# Patient Record
Sex: Female | Born: 1970 | Race: White | Hispanic: No | Smoking: Never smoker
Health system: Southern US, Community
[De-identification: ages and names within clinical notes are randomized; demographics above are authoritative.]

## PROBLEM LIST (undated history)

## (undated) DIAGNOSIS — F419 Anxiety disorder, unspecified: Secondary | ICD-10-CM

## (undated) DIAGNOSIS — H8109 Meniere's disease, unspecified ear: Secondary | ICD-10-CM

## (undated) DIAGNOSIS — I10 Essential (primary) hypertension: Secondary | ICD-10-CM

## (undated) DIAGNOSIS — R42 Dizziness and giddiness: Secondary | ICD-10-CM

## (undated) HISTORY — DX: Essential (primary) hypertension: I10

## (undated) HISTORY — PX: TONSILLECTOMY: SUR1361

## (undated) HISTORY — DX: Dizziness and giddiness: R42

## (undated) HISTORY — DX: Anxiety disorder, unspecified: F41.9

---

## 2006-08-19 ENCOUNTER — Emergency Department: Payer: Self-pay | Admitting: General Practice

## 2006-08-19 ENCOUNTER — Other Ambulatory Visit: Payer: Self-pay

## 2007-01-04 ENCOUNTER — Emergency Department: Payer: Self-pay | Admitting: Emergency Medicine

## 2007-02-27 ENCOUNTER — Observation Stay: Payer: Self-pay | Admitting: Internal Medicine

## 2007-02-27 ENCOUNTER — Other Ambulatory Visit: Payer: Self-pay

## 2008-02-25 ENCOUNTER — Emergency Department: Payer: Self-pay | Admitting: Emergency Medicine

## 2009-11-12 ENCOUNTER — Emergency Department: Payer: Self-pay | Admitting: Unknown Physician Specialty

## 2015-10-20 ENCOUNTER — Other Ambulatory Visit: Payer: Self-pay | Admitting: Internal Medicine

## 2015-10-20 DIAGNOSIS — R05 Cough: Secondary | ICD-10-CM

## 2015-10-20 DIAGNOSIS — R059 Cough, unspecified: Secondary | ICD-10-CM

## 2016-11-23 ENCOUNTER — Emergency Department
Admission: EM | Admit: 2016-11-23 | Discharge: 2016-11-24 | Disposition: A | Discharge status: Home / Self Care | Payer: Medicaid Other | Attending: Emergency Medicine | Admitting: Emergency Medicine

## 2016-11-23 ENCOUNTER — Encounter: Payer: Self-pay | Admitting: Emergency Medicine

## 2016-11-23 ENCOUNTER — Emergency Department: Payer: Medicaid Other

## 2016-11-23 DIAGNOSIS — R0602 Shortness of breath: Secondary | ICD-10-CM | POA: Diagnosis present

## 2016-11-23 DIAGNOSIS — J40 Bronchitis, not specified as acute or chronic: Secondary | ICD-10-CM | POA: Insufficient documentation

## 2016-11-23 DIAGNOSIS — R079 Chest pain, unspecified: Secondary | ICD-10-CM

## 2016-11-23 DIAGNOSIS — R072 Precordial pain: Secondary | ICD-10-CM | POA: Diagnosis not present

## 2016-11-23 NOTE — ED Triage Notes (Signed)
Patient with complaint of central chest pain and shortness of breath that started about 1 hour ago. Patient denies any cardiac history.

## 2016-11-24 LAB — BASIC METABOLIC PANEL
ANION GAP: 9 (ref 5–15)
BUN: 12 mg/dL (ref 6–20)
CALCIUM: 9.2 mg/dL (ref 8.9–10.3)
CHLORIDE: 106 mmol/L (ref 101–111)
CO2: 24 mmol/L (ref 22–32)
Creatinine, Ser: 0.83 mg/dL (ref 0.44–1.00)
GFR calc Af Amer: >60 mL/min (ref >60)
GFR calc non Af Amer: >60 mL/min (ref >60)
GLUCOSE: 101 mg/dL — AB (ref 65–99)
Potassium: 3.6 mmol/L (ref 3.5–5.1)
Sodium: 139 mmol/L (ref 135–145)

## 2016-11-24 LAB — TSH: TSH: 2.519 u[IU]/mL (ref 0.350–4.500)

## 2016-11-24 LAB — CBC
HCT: 38.6 % (ref 35.0–47.0)
HEMOGLOBIN: 13.3 g/dL (ref 12.0–16.0)
MCH: 30.9 pg (ref 26.0–34.0)
MCHC: 34.6 g/dL (ref 32.0–36.0)
MCV: 89.2 fL (ref 80.0–100.0)
Platelets: 271 10*3/uL (ref 150–440)
RBC: 4.32 MIL/uL (ref 3.80–5.20)
RDW: 13.4 % (ref 11.5–14.5)
WBC: 12.4 10*3/uL — ABNORMAL HIGH (ref 3.6–11.0)

## 2016-11-24 LAB — FIBRIN DERIVATIVES D-DIMER (ARMC ONLY): Fibrin derivatives D-dimer (ARMC): 209.81 (ref 0.00–499.00)

## 2016-11-24 LAB — TROPONIN I: Troponin I: <0.03 ng/mL (ref <0.03)

## 2016-11-24 MED ORDER — SODIUM CHLORIDE 0.9 % IV BOLUS (SEPSIS)
1000.0000 mL | Freq: Once | INTRAVENOUS | Status: AC
  Administered 2016-11-24: 1000 mL via INTRAVENOUS

## 2016-11-24 MED ORDER — ALBUTEROL SULFATE HFA 108 (90 BASE) MCG/ACT IN AERS: 2.0000 | INHALATION_SPRAY | RESPIRATORY_TRACT | 0 refills | Status: DC | PRN

## 2016-11-24 MED ORDER — IPRATROPIUM-ALBUTEROL 0.5-2.5 (3) MG/3ML IN SOLN
3.0000 mL | Freq: Once | RESPIRATORY_TRACT | Status: AC
  Administered 2016-11-24: 3 mL via RESPIRATORY_TRACT
  Filled 2016-11-24: qty 3

## 2016-11-24 MED ORDER — DIPHENHYDRAMINE HCL 50 MG/ML IJ SOLN: 25.0000 mg | Freq: Once | INTRAMUSCULAR | Status: DC

## 2016-11-24 MED ORDER — HYDROCORTISONE NA SUCCINATE PF 100 MG IJ SOLR
200.0000 mg | Freq: Once | INTRAMUSCULAR | Status: AC
  Administered 2016-11-24: 200 mg via INTRAVENOUS
  Filled 2016-11-24: qty 4

## 2016-11-24 NOTE — ED Notes (Addendum)
Dr Beather Arbour at bedside to discuss plan of care with pt; pt says she's had a reaction before to IV dye but unsure exactly what happened; trying to reach her sister on the phone to see if she remembers

## 2016-11-24 NOTE — ED Notes (Signed)
Visitor has arrived and is at bedside

## 2016-11-24 NOTE — ED Notes (Signed)
Pt updated on status of repeat troponin results. Pt verbalizes understanding.

## 2016-11-24 NOTE — ED Notes (Signed)
Attempt int initiation x1 without success. Pt states she has had to have feet iv and central lines in past. jenna, rn speaking with david, rn requesting iv ultrasound insertion assistance.

## 2016-11-24 NOTE — ED Notes (Signed)
Reaction to IV dye reported to Dr Beather Arbour

## 2016-11-24 NOTE — Discharge Instructions (Signed)
1. You may use albuterol inhaler 2 puffs every 4 hours as needed for wheezing. 2. Return to the ER for worsening symptoms, persistent vomiting, difficulty breathing or other concerns.

## 2016-11-24 NOTE — ED Provider Notes (Signed)
[facility] Emergency Department Provider Note   ____________________________________________   First MD Initiated Contact with Patient 11/24/16 0003     (approximate)  I have reviewed the triage vital signs and the nursing notes.   HISTORY  Chief Complaint Chest Pain and Shortness of Breath    HPI Dawn Taylor is a 46 y.o. female who presents to the ED from home with a chief complaint of chest pain and shortness of breath. Patient reports onset of symptoms approximately one hour ago. Describes sharp substernal chest pain which is nonradiating. Symptoms associated with shortness of breath, wheezing and bilateral arm numbness at home. Denies associated nausea/vomiting, dizziness, palpitations. Patient states this feels similarly when she developed bronchitis. She has been coughing yellow sputum. Denies fever, chills, abdominal pain, diarrhea. Went to [employer] 1 month ago. Symptoms worsened with inspiration.   Past medical history None  There are no active problems to display for this patient.   History reviewed. No pertinent surgical history.  Prior to Admission medications   Medication Sig Start Date End Date Taking? Authorizing Provider  albuterol (PROVENTIL HFA;VENTOLIN HFA) 108 (90 Base) MCG/ACT inhaler Inhale 2 puffs into the lungs every 4 (four) hours as needed for wheezing or shortness of breath. 11/24/16   Paulette Blanch, MD    Allergies Ivp dye [iodinated diagnostic agents]  No family history on file.  Social History Social History  Substance Use Topics  . Smoking status: Never Smoker  . Smokeless tobacco: Never Used  . Alcohol use No    Review of Systems  Constitutional: No fever/chills. Eyes: No visual changes. ENT: No sore throat. Cardiovascular: Positive for chest pain. Respiratory: Positive for shortness of breath. Gastrointestinal: No abdominal pain.  No nausea, no vomiting.  No diarrhea.  No constipation. Genitourinary:  Negative for dysuria. Musculoskeletal: Negative for back pain. Skin: Negative for rash. Neurological: Negative for headaches, focal weakness or numbness.  10-point ROS otherwise negative.  ____________________________________________   PHYSICAL EXAM:  VITAL SIGNS: ED Triage Vitals  Enc Vitals Group     BP --      Pulse --      Resp --      Temp --      Temp src --      SpO2 --      Weight 11/23/16 2344 180 lb (81.6 kg)     Height 11/23/16 2344 5\' 4"  (1.626 m)     Head Circumference --      Peak Flow --      Pain Score 11/23/16 2343 7     Pain Loc --      Pain Edu? --      Excl. in [place]? --     Constitutional: Alert and oriented. Well appearing and in no acute distress. Eyes: Conjunctivae are normal. PERRL. EOMI. Head: Atraumatic. Nose: No congestion/rhinnorhea. Mouth/Throat: Mucous membranes are moist.  Oropharynx non-erythematous. Neck: No stridor.   Cardiovascular: Normal rate, regular rhythm. Grossly normal heart sounds.  Good peripheral circulation.  2+ radial, femoral and distal pulses. Respiratory: Normal respiratory effort.  No retractions. Lungs with scattered wheezing. Gastrointestinal: Soft and nontender. No distention. No abdominal bruits. No CVA tenderness. Musculoskeletal: No lower extremity tenderness nor edema.  No joint effusions. Neurologic:  Normal speech and language. No gross focal neurologic deficits are appreciated.  Skin:  Skin is warm, dry and intact. No rash noted. Psychiatric: Mood and affect are normal. Speech and behavior are normal.  ____________________________________________   LABS (  all labs ordered are listed, but only abnormal results are displayed)  Labs Reviewed  BASIC METABOLIC PANEL - Abnormal; Notable for the following:       Result Value   Glucose, Bld 101 (*)    All other components within normal limits  CBC - Abnormal; Notable for the following:    WBC 12.4 (*)    All other components within normal limits  TROPONIN I    TSH  FIBRIN DERIVATIVES D-DIMER ([facility] ONLY)  TROPONIN I   ____________________________________________  EKG  ED ECG REPORT I, SUNG,JADE J, the attending physician, personally viewed and interpreted this ECG.   Date: 11/24/2016  EKG Time: 2346  Rate: 118  Rhythm: sinus tachycardia  Axis: Normal  Intervals:none  ST&T Change: Nonspecific  ____________________________________________  RADIOLOGY  Chest x-ray interpreted per Dr. Radene Knee: Peribronchial thickening noted. Lungs remain otherwise clear. ____________________________________________   PROCEDURES  Procedure(s) performed: None  Procedures  Critical Care performed: No  ____________________________________________   INITIAL IMPRESSION / ASSESSMENT AND PLAN / ED COURSE  Pertinent labs & imaging results that were available during my care of the patient were reviewed by me and considered in my medical decision making (see chart for details).  46 year old female who presents with chest pain and shortness of breath. Blood pressure in BUE are symmetrical. Slight wheezing noted on exam. We will check screening lab work, administer DuoNeb, and d-dimer. Consider CT scan.  Clinical Course as of Nov 24 512  Sun Nov 24, 2016  0237 Pulse rate 102. Room air saturations 100%. No wheezing after DuoNeb treatment. D-dimer is negative. Discussed with patient and will cancel CT scan. In light of negative d-dimer and patient's good clinical appearance, I have very low suspicion for PE/dissection. Especially given patient's prior anaphylactic reaction to IV dye, I think it would be judicious to cancel CT scan in light of negative d-dimer. Patient heartedly agrees asshe was anxious for her CT scan. Will repeat troponin. Of note, patient reports at baseline her resting heart rate is above 100.  [JS]  5320 Repeat troponin remains negative. Updated patient. She had hydrocortisone which should help with bronchitis symptoms. Will discharge  home with albuterol inhaler to use as needed. Strict return precautions given. Patient verbalizes understanding and agrees with plan of care.  [JS]    Clinical Course User Index [JS] Paulette Blanch, MD     ____________________________________________   FINAL CLINICAL IMPRESSION(S) / ED DIAGNOSES  Final diagnoses:  Chest pain, unspecified type  Bronchitis      NEW MEDICATIONS STARTED DURING THIS VISIT:  Discharge Medication List as of 11/24/2016  4:00 AM    START taking these medications   Details  albuterol (PROVENTIL HFA;VENTOLIN HFA) 108 (90 Base) MCG/ACT inhaler Inhale 2 puffs into the lungs every 4 (four) hours as needed for wheezing or shortness of breath., Starting Sun 11/24/2016, Print         Note:  This document was prepared using Dragon voice recognition software and may include unintentional dictation errors.    Paulette Blanch, MD 11/24/16 [phone]

## 2016-11-24 NOTE — ED Notes (Signed)
Pt resting in bed, new visitor at bedside-pt's boyfriend; pt says she was able to reach her sister to get more information about her allergic reaction; pt says her sister reports her getting hives all over her body last time she had IV dye and then her throat began to close; will notify MD of same

## 2016-11-24 NOTE — ED Notes (Signed)
Shanon Brow, rn in to attempt ultrasound guided iv insertion.

## 2016-11-24 NOTE — ED Notes (Signed)
Pt back to bed, Dr Beather Arbour in to follow up

## 2016-11-24 NOTE — ED Notes (Signed)
Pt using call bell; unhooked from monitor and blood pressure cuff to use toilet in room; ambulatory with steady gait

## 2017-06-20 ENCOUNTER — Encounter: Payer: Self-pay | Admitting: Maternal Newborn

## 2017-07-09 ENCOUNTER — Encounter: Payer: Self-pay | Admitting: Emergency Medicine

## 2017-07-09 ENCOUNTER — Ambulatory Visit: Payer: Medicaid Other

## 2017-07-09 ENCOUNTER — Ambulatory Visit
Admission: EM | Admit: 2017-07-09 | Discharge: 2017-07-09 | Disposition: A | Discharge status: Home / Self Care | Payer: Medicaid Other | Attending: Family Medicine | Admitting: Family Medicine

## 2017-07-09 ENCOUNTER — Other Ambulatory Visit: Payer: Self-pay

## 2017-07-09 DIAGNOSIS — M7989 Other specified soft tissue disorders: Secondary | ICD-10-CM | POA: Diagnosis not present

## 2017-07-09 DIAGNOSIS — Z833 Family history of diabetes mellitus: Secondary | ICD-10-CM | POA: Diagnosis not present

## 2017-07-09 DIAGNOSIS — M79672 Pain in left foot: Secondary | ICD-10-CM

## 2017-07-09 DIAGNOSIS — Z8249 Family history of ischemic heart disease and other diseases of the circulatory system: Secondary | ICD-10-CM | POA: Insufficient documentation

## 2017-07-09 HISTORY — DX: Meniere's disease, unspecified ear: H81.09

## 2017-07-09 MED ORDER — MELOXICAM 15 MG PO TABS: 15.0000 mg | ORAL_TABLET | Freq: Every day | ORAL | 0 refills | Status: DC | PRN

## 2017-07-09 NOTE — ED Triage Notes (Addendum)
Patient in today c/o left foot pain since yesterday morning. Patient states she woke up and when she stood up, she had pain. No injury noted. Patient has tried Tylenol and Ibuprofen without relief.

## 2017-07-09 NOTE — Discharge Instructions (Signed)
Take medication as prescribed. Rest. Drink plenty of fluids. Ice and elevate. Use post operative shoe as long as needed, gradually increase activity.  Follow up with your primary care physician or the above, this week as needed. Return to Urgent care for new or worsening concerns.

## 2017-07-09 NOTE — ED Provider Notes (Signed)
[facility] ____________________________________________  Time seen: Approximately 4:00 PM  I have reviewed the triage vital signs and the nursing notes.   HISTORY  Chief Complaint Foot Pain  HPI Dawn Taylor is a 46 y.o. female presenting for evaluation of left dorsal foot pain that is been present upon awakening yesterday morning.  Patient states that pain is mild at rest, moderate with walking and weightbearing.  States unsure of trigger.  States that she does have a large dog was jumping on her son the night before, but does not recall specific injury.  States that she does walk a lot.  Denies any redness, skin changes, insect bite or known trauma.  Reports his continue remain ambulatory, primarily walking on her heel. Denies fevers. Denies paresthesias, pain radiation or other complaints.  Reports otherwise feels well.  States did take some over-the-counter Tylenol and then 1 dose of ibuprofen last night, ibuprofen, and Tylenol.  Reports otherwise feels well.  Denies previous issues with left foot. Denies chest pain, shortness of breath, abdominal pain, or rash. Denies recent sickness. Denies recent antibiotic use.   Marden Noble, MD: PCP Patient's last menstrual period was 06/29/2017.   Past Medical History:  Diagnosis Date  . Meniere disease     There are no active problems to display for this patient.   Past Surgical History:  Procedure Laterality Date  . CESAREAN SECTION    . TONSILLECTOMY       No current facility-administered medications for this encounter.   Current Outpatient Medications:  .  meloxicam (MOBIC) 15 MG tablet, Take 1 tablet (15 mg total) by mouth daily as needed., Disp: 10 tablet, Rfl: 0  Allergies Ivp dye [iodinated diagnostic agents]  Family History  Problem Relation Age of Onset  . Diabetes Mother   . Hypertension Mother   . Heart disease Father     Social History Social History   Tobacco Use  . Smoking status:  Never Smoker  . Smokeless tobacco: Never Used  Substance Use Topics  . Alcohol use: No  . Drug use: No    Review of Systems Constitutional: No fever/chills Cardiovascular: Denies chest pain. Respiratory: Denies shortness of breath. Gastrointestinal: No abdominal pain.   Musculoskeletal: Negative for back pain. As above.  Skin: Negative for rash.   ____________________________________________   PHYSICAL EXAM:  VITAL SIGNS: ED Triage Vitals  Enc Vitals Group     BP 07/09/17 1522 (!) 156/94     Pulse Rate 07/09/17 1522 (!) 108     Resp 07/09/17 1522 16     Temp 07/09/17 1522 98.2 F (36.8 C)     Temp Source 07/09/17 1522 Oral     SpO2 07/09/17 1522 99 %     Weight 07/09/17 1522 160 lb (72.6 kg)     Height 07/09/17 1522 5\' 3"  (1.6 m)     Head Circumference --      Peak Flow --      Pain Score 07/09/17 1523 9     Pain Loc --      Pain Edu? --      Excl. in [place]? --     Constitutional: Alert and oriented. Well appearing and in no acute distress. Cardiovascular: Normal rate, regular rhythm. Grossly normal heart sounds.  Good peripheral circulation. Respiratory: Normal respiratory effort without tachypnea nor retractions. Breath sounds are clear and equal bilaterally. No wheezes, rales, rhonchi. Musculoskeletal: Bilateral pedal pulses equal and easily palpated. Except: Left dorsal foot over the mid  second third and fourth metatarsals mild swelling present, swelling is soft, no fluctuance, no induration, no ecchymosis, no erythema, diffuse mild to moderate tenderness associated, normal distal sensation, normal display capillary refill, left foot otherwise nontender.  No motor or tendon deficits noted.  Left lower externally otherwise nontender.  Ambulatorywith mild antalgic gait.   Neurologic:  Normal speech and language. Speech is normal. No gait instability.  Skin:  Skin is warm, dry and intact. No rash noted. Psychiatric: Mood and affect are normal. Speech and behavior are  normal. Patient exhibits appropriate insight and judgment   ___________________________________________   LABS (all labs ordered are listed, but only abnormal results are displayed)  Labs Reviewed - No data to display  RADIOLOGY  Dg Foot Complete Left  Result Date: 07/09/2017 CLINICAL DATA:  Left foot pain beginning yesterday morning. EXAM: LEFT FOOT - COMPLETE 3+ VIEW COMPARISON:  None. FINDINGS: Soft tissue swelling is present over the dorsum of foot. There is no underlying acute or healing fracture. No radiopaque foreign body is present. IMPRESSION: 1. Soft swelling over the dorsum of foot without acute or focal osseous abnormality. Question cellulitis or soft tissue injury. Electronically Signed   By: San Morelle M.D.   On: 07/09/2017 16:06   ____________________________________________   PROCEDURES Procedures    INITIAL IMPRESSION / ASSESSMENT AND PLAN / ED COURSE  Pertinent labs & imaging results that were available during my care of the patient were reviewed by me and considered in my medical decision making (see chart for details).  Well-appearing patient.  No acute distress.  Left dorsal foot pain.  Suspect strain versus inflammatory injury.  Left foot x-ray soft tissue swelling without focal osseous abnormality, question cellulitis or soft tissue injury per radiologist.  No skin changes, doubt cellulitis.  Doubt gout.  Will treat patient with postoperative shoe and oral daily Mobic.  Encouraged rest, ice and supportive care.  Discussed follow-up and return parameters.Discussed indication, risks and benefits of medications with patient.  Discussed follow up with Primary care physician or orthopedic this week. Discussed follow up and return parameters including no resolution or any worsening concerns. Patient verbalized understanding and agreed to plan.   ____________________________________________   FINAL CLINICAL IMPRESSION(S) / ED DIAGNOSES  Final diagnoses:    Left foot pain     ED Discharge Orders        Ordered    meloxicam (MOBIC) 15 MG tablet  Daily PRN     07/09/17 1622       Note: This dictation was prepared with Dragon dictation along with smaller phrase technology. Any transcriptional errors that result from this process are unintentional.         Marylene Land, NP 07/09/17 [phone]

## 2019-02-11 ENCOUNTER — Ambulatory Visit: Payer: Medicaid Other | Admitting: Family Medicine

## 2019-02-11 ENCOUNTER — Encounter: Payer: Self-pay | Admitting: Family Medicine

## 2019-02-11 ENCOUNTER — Other Ambulatory Visit: Payer: Self-pay

## 2019-02-11 VITALS — BP 141/85 | HR 91 | Temp 98.4°F | Ht 62.0 in | Wt 179.0 lb

## 2019-02-11 DIAGNOSIS — Z7689 Persons encountering health services in other specified circumstances: Secondary | ICD-10-CM

## 2019-02-11 DIAGNOSIS — I10 Essential (primary) hypertension: Secondary | ICD-10-CM | POA: Diagnosis not present

## 2019-02-11 DIAGNOSIS — F419 Anxiety disorder, unspecified: Secondary | ICD-10-CM | POA: Diagnosis not present

## 2019-02-11 DIAGNOSIS — H8102 Meniere's disease, left ear: Secondary | ICD-10-CM | POA: Diagnosis not present

## 2019-02-11 DIAGNOSIS — H8109 Meniere's disease, unspecified ear: Secondary | ICD-10-CM | POA: Insufficient documentation

## 2019-02-11 MED ORDER — AMLODIPINE BESYLATE 5 MG PO TABS: 5.0000 mg | ORAL_TABLET | Freq: Every day | ORAL | 0 refills | Status: DC

## 2019-02-11 MED ORDER — MECLIZINE HCL 25 MG PO TABS: 25.0000 mg | ORAL_TABLET | Freq: Three times a day (TID) | ORAL | 0 refills | Status: DC | PRN

## 2019-02-11 MED ORDER — PROMETHAZINE HCL 25 MG PO TABS: 25.0000 mg | ORAL_TABLET | Freq: Three times a day (TID) | ORAL | 0 refills | Status: DC | PRN

## 2019-02-11 NOTE — Progress Notes (Signed)
BP (!) 141/85   Pulse 91   Temp 98.4 F (36.9 C) (Oral)   Ht 5\' 2"  (1.575 m)   Wt 179 lb (81.2 kg)   SpO2 97%   BMI 32.74 kg/m    Subjective:    Patient ID: Dawn Taylor, female    DOB: 21-Aug-1970, 48 y.o.   MRN: [id number]  HPI: Dawn Taylor is a 48 y.o. female  Chief Complaint  Patient presents with  . Establish Care    would like to discuse BP, maniere disease   Patient presents today to establish care. States her previous PCP practice closed down without warning about a year ago and she's been off of all of her medications for most of that time. Unable to obtain any of her records from this practice after multiple attempts as well.   HTN - Has recently lost about 20 lb, states her BPs were way higher prior to that. Was previously on triamterene HCTZ but that gave her headaches she thinks. Has angioedema with ACEI. Denies CP, SOB, HAs, dizziness.   Hx of meniere disease the past 11 years, has daily headaches, dizziness, nausea. Has taken meclizine and phenergan in the past with occasional prednisone for flares. Takes zyrtec D prn. Has also tried transderm patches in the past. Has not been on anything for about 8 months now. Has hearing loss in the left ear. Has been to [facility] and an ENT in [place] and Dr. Pryor Ochoa at [facility] in the past.   Hx of panic disorder and anxiety issues. Used to take ativan as needed but hasn't had anything in about  ayear and thinks she's ok for now.   Relevant past medical, surgical, family and social history reviewed and updated as indicated. Interim medical history since our last visit reviewed. Allergies and medications reviewed and updated.  Review of Systems  Per HPI unless specifically indicated above     Objective:    BP (!) 141/85   Pulse 91   Temp 98.4 F (36.9 C) (Oral)   Ht 5\' 2"  (1.575 m)   Wt 179 lb (81.2 kg)   SpO2 97%   BMI 32.74 kg/m   Wt Readings from Last 3 Encounters:  02/11/19 179 lb (81.2 kg)   07/09/17 160 lb (72.6 kg)  11/23/16 180 lb (81.6 kg)    Physical Exam Vitals signs and nursing note reviewed.  Constitutional:      Appearance: Normal appearance. She is not ill-appearing.  HENT:     Head: Atraumatic.     Right Ear: External ear normal.     Left Ear: External ear normal.     Nose: Nose normal.     Mouth/Throat:     Mouth: Mucous membranes are moist.     Pharynx: Oropharynx is clear.  Eyes:     Extraocular Movements: Extraocular movements intact.     Conjunctiva/sclera: Conjunctivae normal.  Neck:     Musculoskeletal: Normal range of motion and neck supple.  Cardiovascular:     Rate and Rhythm: Normal rate and regular rhythm.     Heart sounds: Normal heart sounds.  Pulmonary:     Effort: Pulmonary effort is normal.     Breath sounds: Normal breath sounds.  Musculoskeletal: Normal range of motion.  Skin:    General: Skin is warm and dry.  Neurological:     Mental Status: She is alert and oriented to person, place, and time.  Psychiatric:  Mood and Affect: Mood normal.        Thought Content: Thought content normal.        Judgment: Judgment normal.     Results for orders placed or performed during the hospital encounter of 28/36/62  Basic metabolic panel  Result Value Ref Range   Sodium 139 135 - 145 mmol/L   Potassium 3.6 3.5 - 5.1 mmol/L   Chloride 106 101 - 111 mmol/L   CO2 24 22 - 32 mmol/L   Glucose, Bld 101 (H) 65 - 99 mg/dL   BUN 12 6 - 20 mg/dL   Creatinine, Ser 0.83 0.44 - 1.00 mg/dL   Calcium 9.2 8.9 - 10.3 mg/dL   GFR calc non Af Amer >60 >60 mL/min   GFR calc Af Amer >60 >60 mL/min   Anion gap 9 5 - 15  CBC  Result Value Ref Range   WBC 12.4 (H) 3.6 - 11.0 K/uL   RBC 4.32 3.80 - 5.20 MIL/uL   Hemoglobin 13.3 12.0 - 16.0 g/dL   HCT 38.6 35.0 - 47.0 %   MCV 89.2 80.0 - 100.0 fL   MCH 30.9 26.0 - 34.0 pg   MCHC 34.6 32.0 - 36.0 g/dL   RDW 13.4 11.5 - 14.5 %   Platelets 271 150 - 440 K/uL  Troponin I  Result Value Ref Range    Troponin I <0.03 <0.03 ng/mL  TSH  Result Value Ref Range   TSH 2.519 0.350 - 4.500 uIU/mL  Fibrin derivatives D-Dimer  Result Value Ref Range   Fibrin derivatives D-dimer ([facility]) 209.81 0.00 - 499.00  Troponin I  Result Value Ref Range   Troponin I <0.03 <0.03 ng/mL      Assessment & Plan:   Problem List Items Addressed This Visit      Cardiovascular and Mediastinum   Essential hypertension    BPs not at goal off medication, will start amlodipine and monitor home readings closely. DASH diet, good exercise and stress reduction reviewed      Relevant Medications   amLODipine (NORVASC) 5 MG tablet     Nervous and Auditory   Meniere disease - Primary    Will refer to ENT for another opinion and in meantime restart meclizine and phenergan for prn use. Avoid zyrtec D due to HTN but ok to try plain allergy medications. May require occasional prednisone for these flares until able to get in with specialist      Relevant Orders   Ambulatory referral to ENT     Other   Anxiety    Stable off medications at this time       Other Visit Diagnoses    Encounter to establish care           Follow up plan: Return in about 4 weeks (around 03/11/2019) for BP.

## 2019-02-14 DIAGNOSIS — I10 Essential (primary) hypertension: Secondary | ICD-10-CM | POA: Insufficient documentation

## 2019-02-14 DIAGNOSIS — F419 Anxiety disorder, unspecified: Secondary | ICD-10-CM | POA: Insufficient documentation

## 2019-02-14 NOTE — Assessment & Plan Note (Signed)
Stable off medications at this time.  

## 2019-02-14 NOTE — Assessment & Plan Note (Signed)
BPs not at goal off medication, will start amlodipine and monitor home readings closely. DASH diet, good exercise and stress reduction reviewed

## 2019-02-14 NOTE — Assessment & Plan Note (Signed)
Will refer to ENT for another opinion and in meantime restart meclizine and phenergan for prn use. Avoid zyrtec D due to HTN but ok to try plain allergy medications. May require occasional prednisone for these flares until able to get in with specialist

## 2019-03-03 ENCOUNTER — Telehealth: Payer: Self-pay | Admitting: Family Medicine

## 2019-03-03 MED ORDER — EPINEPHRINE 0.3 MG/0.3ML IJ SOAJ
0.3000 mg | INTRAMUSCULAR | 0 refills | Status: DC | PRN
Start: 2019-03-03 — End: 2024-06-17

## 2019-03-03 NOTE — Telephone Encounter (Signed)
Relation to pt: self  Call back number:  [phone]  Pharmacy: [facility], [address] [place] [phone] (Phone) [phone] (Fax)    Reason for call:  Patient states she failed to inform PCP patient is in need of a epipen, please advise when Rx is sent in. Patient also states she received her insurance card and will drop by the office so insurance card can be scanned in.

## 2019-03-03 NOTE — Telephone Encounter (Signed)
Routing to provider  

## 2019-03-03 NOTE — Telephone Encounter (Signed)
rx sent

## 2019-03-05 NOTE — Telephone Encounter (Signed)
Called pt, no answer. Unable to LVM.

## 2019-03-09 ENCOUNTER — Encounter: Payer: Self-pay | Admitting: Family Medicine

## 2019-03-10 NOTE — Telephone Encounter (Signed)
Needs appt

## 2019-03-11 ENCOUNTER — Other Ambulatory Visit: Payer: Self-pay

## 2019-03-11 ENCOUNTER — Ambulatory Visit: Payer: Medicaid Other | Admitting: Family Medicine

## 2019-03-11 ENCOUNTER — Encounter: Payer: Self-pay | Admitting: Family Medicine

## 2019-03-11 VITALS — BP 115/80 | HR 97 | Temp 98.5°F | Ht 63.0 in | Wt 185.0 lb

## 2019-03-11 DIAGNOSIS — I10 Essential (primary) hypertension: Secondary | ICD-10-CM

## 2019-03-11 DIAGNOSIS — H8102 Meniere's disease, left ear: Secondary | ICD-10-CM | POA: Diagnosis not present

## 2019-03-11 DIAGNOSIS — D489 Neoplasm of uncertain behavior, unspecified: Secondary | ICD-10-CM

## 2019-03-11 MED ORDER — AMLODIPINE BESYLATE 5 MG PO TABS: 5.0000 mg | ORAL_TABLET | Freq: Every day | ORAL | 1 refills | Status: DC

## 2019-03-11 NOTE — Assessment & Plan Note (Signed)
Await ENT est care visit next week. Continue current regimen in meantime

## 2019-03-11 NOTE — Progress Notes (Signed)
BP 115/80   Pulse 97   Temp 98.5 F (36.9 C) (Oral)   Ht 5\' 3"  (1.6 m)   Wt 185 lb (83.9 kg)   SpO2 97%   BMI 32.77 kg/m    Subjective:    Patient ID: Dawn Taylor, female    DOB: 1970-11-13, 48 y.o.   MRN: [id number]  HPI: Dawn Taylor is a 48 y.o. female  Chief Complaint  Patient presents with  . Hypertension   Patient presenting today for 1 month HTN f/u after starting 5 mg amlodipine. Has not been checking home BPs as she does not have a machine. Tolerating the medication well, denies CP, SOB, HAs, dizziness.   Has two places on her left cheek that she is requesting a referral to Dermatology as soon as possible to evaluate further. Sometimes itches and peels, seems to be growing lately. Has not tried anything OTC for sxs.   Struggling with her meniere's disease, having some headaches and dizziness. Set to see ENT next week. Taking meclizine prn right now for sxs with prn phenergan for nausea.   Relevant past medical, surgical, family and social history reviewed and updated as indicated. Interim medical history since our last visit reviewed. Allergies and medications reviewed and updated.  Review of Systems  Per HPI unless specifically indicated above     Objective:    BP 115/80   Pulse 97   Temp 98.5 F (36.9 C) (Oral)   Ht 5\' 3"  (1.6 m)   Wt 185 lb (83.9 kg)   SpO2 97%   BMI 32.77 kg/m   Wt Readings from Last 3 Encounters:  03/11/19 185 lb (83.9 kg)  02/11/19 179 lb (81.2 kg)  07/09/17 160 lb (72.6 kg)    Physical Exam Vitals signs and nursing note reviewed.  Constitutional:      Appearance: Normal appearance. She is not ill-appearing.  HENT:     Head: Atraumatic.  Eyes:     Extraocular Movements: Extraocular movements intact.     Conjunctiva/sclera: Conjunctivae normal.  Neck:     Musculoskeletal: Normal range of motion and neck supple.  Cardiovascular:     Rate and Rhythm: Normal rate and regular rhythm.     Heart sounds: Normal heart  sounds.  Pulmonary:     Effort: Pulmonary effort is normal.     Breath sounds: Normal breath sounds.  Musculoskeletal: Normal range of motion.  Skin:    General: Skin is warm and dry.  Neurological:     Mental Status: She is alert and oriented to person, place, and time.  Psychiatric:        Mood and Affect: Mood normal.        Thought Content: Thought content normal.        Judgment: Judgment normal.     Results for orders placed or performed during the hospital encounter of 19/50/93  Basic metabolic panel  Result Value Ref Range   Sodium 139 135 - 145 mmol/L   Potassium 3.6 3.5 - 5.1 mmol/L   Chloride 106 101 - 111 mmol/L   CO2 24 22 - 32 mmol/L   Glucose, Bld 101 (H) 65 - 99 mg/dL   BUN 12 6 - 20 mg/dL   Creatinine, Ser 0.83 0.44 - 1.00 mg/dL   Calcium 9.2 8.9 - 10.3 mg/dL   GFR calc non Af Amer >60 >60 mL/min   GFR calc Af Amer >60 >60 mL/min   Anion gap 9 5 - 15  CBC  Result Value Ref Range   WBC 12.4 (H) 3.6 - 11.0 K/uL   RBC 4.32 3.80 - 5.20 MIL/uL   Hemoglobin 13.3 12.0 - 16.0 g/dL   HCT 38.6 35.0 - 47.0 %   MCV 89.2 80.0 - 100.0 fL   MCH 30.9 26.0 - 34.0 pg   MCHC 34.6 32.0 - 36.0 g/dL   RDW 13.4 11.5 - 14.5 %   Platelets 271 150 - 440 K/uL  Troponin I  Result Value Ref Range   Troponin I <0.03 <0.03 ng/mL  TSH  Result Value Ref Range   TSH 2.519 0.350 - 4.500 uIU/mL  Fibrin derivatives D-Dimer  Result Value Ref Range   Fibrin derivatives D-dimer ([facility]) 209.81 0.00 - 499.00  Troponin I  Result Value Ref Range   Troponin I <0.03 <0.03 ng/mL      Assessment & Plan:   Problem List Items Addressed This Visit      Cardiovascular and Mediastinum   Essential hypertension - Primary    BP stable and WNL, continue current regimen      Relevant Medications   amLODipine (NORVASC) 5 MG tablet     Nervous and Auditory   Meniere disease    Await ENT est care visit next week. Continue current regimen in meantime       Other Visit Diagnoses     Neoplasm of uncertain behavior       Referral placed per patient request to Dermatology.    Relevant Orders   Ambulatory referral to Dermatology       Follow up plan: Return in about 6 months (around 09/11/2019) for BP f/u.

## 2019-03-11 NOTE — Assessment & Plan Note (Signed)
BP stable and WNL, continue current regimen

## 2019-04-20 DIAGNOSIS — D2239 Melanocytic nevi of other parts of face: Secondary | ICD-10-CM | POA: Diagnosis not present

## 2019-04-20 DIAGNOSIS — R21 Rash and other nonspecific skin eruption: Secondary | ICD-10-CM | POA: Diagnosis not present

## 2019-04-20 DIAGNOSIS — D485 Neoplasm of uncertain behavior of skin: Secondary | ICD-10-CM | POA: Diagnosis not present

## 2019-04-26 ENCOUNTER — Other Ambulatory Visit: Payer: Self-pay

## 2019-04-26 ENCOUNTER — Ambulatory Visit (INDEPENDENT_AMBULATORY_CARE_PROVIDER_SITE_OTHER): Payer: Medicaid Other | Admitting: Family Medicine

## 2019-04-26 ENCOUNTER — Encounter: Payer: Self-pay | Admitting: Family Medicine

## 2019-04-26 DIAGNOSIS — J0120 Acute ethmoidal sinusitis, unspecified: Secondary | ICD-10-CM

## 2019-04-26 MED ORDER — AMOXICILLIN-POT CLAVULANATE 875-125 MG PO TABS: 1.0000 | ORAL_TABLET | Freq: Two times a day (BID) | ORAL | 0 refills | Status: DC

## 2019-04-26 NOTE — Progress Notes (Signed)
There were no vitals taken for this visit.   Subjective:    Patient ID: Dawn Taylor, female    DOB: 03/23/1971, 48 y.o.   MRN: [id number]  HPI: Dawn Taylor is a 48 y.o. female  Chief Complaint  Patient presents with  . Sinusitis    Facial pressure and pain. Some nasal congestion. Ongoing 4-5 days.    . This visit was completed via WebEx due to the restrictions of the COVID-19 pandemic. All issues as above were discussed and addressed. Physical exam was done as above through visual confirmation on WebEx. If it was felt that the patient should be evaluated in the office, they were directed there. The patient verbally consented to this visit. . Location of the patient: home . Location of the provider: work . Those involved with this call:  . Provider: Merrie Roof, PA-C . CMA: Merilyn Baba, [place] . Front Desk/Registration: Jill Side  . Time spent on call: 15 minutes with patient face to face via video conference. More than 50% of this time was spent in counseling and coordination of care. 5 minutes total spent in review of patient's record and preparation of their chart. I verified patient identity using two factors (patient name and date of birth). Patient consents verbally to being seen via telemedicine visit today.   Pain and pressure around eyes and nose, congestion, headaches for about 5 days now. Taking high doses of tylenol without relief and has been taking some zyrtec D without relief. Denies fevers, chills, body aches, CP, SOB, sick contacts, recent travel. Does tend to get sinus infections at least once a year.   Relevant past medical, surgical, family and social history reviewed and updated as indicated. Interim medical history since our last visit reviewed. Allergies and medications reviewed and updated.  Review of Systems  Per HPI unless specifically indicated above     Objective:    There were no vitals taken for this visit.  Wt Readings from Last 3  Encounters:  03/11/19 185 lb (83.9 kg)  02/11/19 179 lb (81.2 kg)  07/09/17 160 lb (72.6 kg)    Physical Exam Vitals signs and nursing note reviewed.  Constitutional:      General: She is not in acute distress.    Appearance: Normal appearance.  HENT:     Head: Atraumatic.     Right Ear: External ear normal.     Left Ear: External ear normal.     Nose: Congestion present.     Mouth/Throat:     Mouth: Mucous membranes are moist.     Pharynx: Oropharynx is clear. Posterior oropharyngeal erythema present.  Eyes:     Extraocular Movements: Extraocular movements intact.     Conjunctiva/sclera: Conjunctivae normal.  Neck:     Musculoskeletal: Normal range of motion.  Cardiovascular:     Comments: Unable to assess via virtual visit Pulmonary:     Effort: Pulmonary effort is normal. No respiratory distress.  Musculoskeletal: Normal range of motion.  Skin:    General: Skin is dry.     Findings: No erythema.  Neurological:     Mental Status: She is alert and oriented to person, place, and time.  Psychiatric:        Mood and Affect: Mood normal.        Thought Content: Thought content normal.        Judgment: Judgment normal.     Results for orders placed or performed during the hospital encounter of 11/23/16  Basic metabolic panel  Result Value Ref Range   Sodium 139 135 - 145 mmol/L   Potassium 3.6 3.5 - 5.1 mmol/L   Chloride 106 101 - 111 mmol/L   CO2 24 22 - 32 mmol/L   Glucose, Bld 101 (H) 65 - 99 mg/dL   BUN 12 6 - 20 mg/dL   Creatinine, Ser 0.83 0.44 - 1.00 mg/dL   Calcium 9.2 8.9 - 10.3 mg/dL   GFR calc non Af Amer >60 >60 mL/min   GFR calc Af Amer >60 >60 mL/min   Anion gap 9 5 - 15  CBC  Result Value Ref Range   WBC 12.4 (H) 3.6 - 11.0 K/uL   RBC 4.32 3.80 - 5.20 MIL/uL   Hemoglobin 13.3 12.0 - 16.0 g/dL   HCT 38.6 35.0 - 47.0 %   MCV 89.2 80.0 - 100.0 fL   MCH 30.9 26.0 - 34.0 pg   MCHC 34.6 32.0 - 36.0 g/dL   RDW 13.4 11.5 - 14.5 %   Platelets 271 150  - 440 K/uL  Troponin I  Result Value Ref Range   Troponin I <0.03 <0.03 ng/mL  TSH  Result Value Ref Range   TSH 2.519 0.350 - 4.500 uIU/mL  Fibrin derivatives D-Dimer  Result Value Ref Range   Fibrin derivatives D-dimer ([facility]) 209.81 0.00 - 499.00  Troponin I  Result Value Ref Range   Troponin I <0.03 <0.03 ng/mL      Assessment & Plan:   Problem List Items Addressed This Visit    None    Visit Diagnoses    Acute ethmoidal sinusitis, recurrence not specified    -  Primary   Tx with augmentin, mucinex, sinus rinses, zyrtec. F/u if worsening or not improving   Relevant Medications   amoxicillin-clavulanate (AUGMENTIN) 875-125 MG tablet       Follow up plan: Return if symptoms worsen or fail to improve.

## 2019-06-07 ENCOUNTER — Other Ambulatory Visit: Payer: Self-pay

## 2019-06-07 ENCOUNTER — Ambulatory Visit (INDEPENDENT_AMBULATORY_CARE_PROVIDER_SITE_OTHER): Payer: Medicaid Other | Admitting: Family Medicine

## 2019-06-07 ENCOUNTER — Encounter: Payer: Self-pay | Admitting: Family Medicine

## 2019-06-07 VITALS — BP 149/93 | HR 103 | Temp 98.6°F

## 2019-06-07 DIAGNOSIS — W57XXXA Bitten or stung by nonvenomous insect and other nonvenomous arthropods, initial encounter: Secondary | ICD-10-CM | POA: Diagnosis not present

## 2019-06-07 DIAGNOSIS — S40862A Insect bite (nonvenomous) of left upper arm, initial encounter: Secondary | ICD-10-CM | POA: Diagnosis not present

## 2019-06-07 DIAGNOSIS — L239 Allergic contact dermatitis, unspecified cause: Secondary | ICD-10-CM

## 2019-06-07 MED ORDER — DOXYCYCLINE HYCLATE 100 MG PO TABS: 100.0000 mg | ORAL_TABLET | Freq: Two times a day (BID) | ORAL | 0 refills | Status: DC

## 2019-06-07 MED ORDER — TRIAMCINOLONE ACETONIDE 0.1 % EX CREA: 1.0000 "application " | TOPICAL_CREAM | Freq: Two times a day (BID) | CUTANEOUS | 0 refills | Status: DC

## 2019-06-07 NOTE — Progress Notes (Signed)
BP (!) 149/93   Pulse (!) 103   Temp 98.6 F (37 C) (Oral)   SpO2 98%    Subjective:    Patient ID: Dawn Taylor, female    DOB: 10/14/1970, 48 y.o.   MRN: [id number]  HPI: Dawn Taylor is a 48 y.o. female  Chief Complaint  Patient presents with  . Insect Bite    pt states she was bit by something on Saturday, states it is very painful, warm and red    Patient presenting for a painful, very swollen and draining mass on left upper arm that first appeared 2 days ago. Thinks she was bitten by a spider but did not see it occur. The redness, heat, and pain has been steadily spreading outward since onset. Trying hydrocortisone cream and neosporin, peroxide with minimal relief. Denies fevers, nausea, SOB, throat swelling, rash elsewhere.   Relevant past medical, surgical, family and social history reviewed and updated as indicated. Interim medical history since our last visit reviewed. Allergies and medications reviewed and updated.  Review of Systems  Per HPI unless specifically indicated above     Objective:    BP (!) 149/93   Pulse (!) 103   Temp 98.6 F (37 C) (Oral)   SpO2 98%   Wt Readings from Last 3 Encounters:  03/11/19 185 lb (83.9 kg)  02/11/19 179 lb (81.2 kg)  07/09/17 160 lb (72.6 kg)    Physical Exam Vitals signs and nursing note reviewed.  Constitutional:      Appearance: Normal appearance. She is not ill-appearing.  HENT:     Head: Atraumatic.  Eyes:     Extraocular Movements: Extraocular movements intact.     Conjunctiva/sclera: Conjunctivae normal.  Neck:     Musculoskeletal: Normal range of motion and neck supple.  Cardiovascular:     Rate and Rhythm: Normal rate and regular rhythm.     Heart sounds: Normal heart sounds.  Pulmonary:     Effort: Pulmonary effort is normal.     Breath sounds: Normal breath sounds.  Musculoskeletal: Normal range of motion.        General: Swelling present.  Skin:    General: Skin is warm.     Findings:  Erythema present.     Comments: 1.5 cm abscess present with natural opening, not actively draining, on left upper arm. Significant erythema and edema reaching about 3 cm from abscess, ttp  Neurological:     Mental Status: She is alert and oriented to person, place, and time.  Psychiatric:        Mood and Affect: Mood normal.        Thought Content: Thought content normal.        Judgment: Judgment normal.     Results for orders placed or performed during the hospital encounter of 123XX123  Basic metabolic panel  Result Value Ref Range   Sodium 139 135 - 145 mmol/L   Potassium 3.6 3.5 - 5.1 mmol/L   Chloride 106 101 - 111 mmol/L   CO2 24 22 - 32 mmol/L   Glucose, Bld 101 (H) 65 - 99 mg/dL   BUN 12 6 - 20 mg/dL   Creatinine, Ser 0.83 0.44 - 1.00 mg/dL   Calcium 9.2 8.9 - 10.3 mg/dL   GFR calc non Af Amer >60 >60 mL/min   GFR calc Af Amer >60 >60 mL/min   Anion gap 9 5 - 15  CBC  Result Value Ref Range   WBC 12.4 (  H) 3.6 - 11.0 K/uL   RBC 4.32 3.80 - 5.20 MIL/uL   Hemoglobin 13.3 12.0 - 16.0 g/dL   HCT 38.6 35.0 - 47.0 %   MCV 89.2 80.0 - 100.0 fL   MCH 30.9 26.0 - 34.0 pg   MCHC 34.6 32.0 - 36.0 g/dL   RDW 13.4 11.5 - 14.5 %   Platelets 271 150 - 440 K/uL  Troponin I  Result Value Ref Range   Troponin I <0.03 <0.03 ng/mL  TSH  Result Value Ref Range   TSH 2.519 0.350 - 4.500 uIU/mL  Fibrin derivatives D-Dimer  Result Value Ref Range   Fibrin derivatives D-dimer ([facility]) 209.81 0.00 - 499.00  Troponin I  Result Value Ref Range   Troponin I <0.03 <0.03 ng/mL      Assessment & Plan:   Problem List Items Addressed This Visit    None    Visit Diagnoses    Allergic dermatitis    -  Primary   Suspect to be from spider bite. Tx with doxycycline, triamcinolone, antihistamine, ice. Has epi pen if worsening, call for oral prednisone if not improving   Insect bite of left upper arm, initial encounter           Follow up plan: Return if symptoms worsen or fail to  improve.

## 2019-07-23 ENCOUNTER — Telehealth: Payer: Self-pay | Admitting: Family Medicine

## 2019-07-23 DIAGNOSIS — Z20822 Contact with and (suspected) exposure to covid-19: Secondary | ICD-10-CM

## 2019-07-23 NOTE — Telephone Encounter (Signed)
Pt states she has lost taste and smell for about 10 days.  No appointments today in office. If the dr would like pt to have covid test, please put in order and let me know.  Pt states she can go to [facility] today if you think she should.  Pt states she did have severe headache at onset of loosing taste and smell.

## 2019-07-23 NOTE — Telephone Encounter (Signed)
Called pt she states that she is going to go to [facility]

## 2019-07-23 NOTE — Telephone Encounter (Signed)
Order placed, please let the patient know she will need to make an appt for testing through the hospital or if she would prefer she can go to [facility] to be seen. If getting tested through [place] will need OV Monday or ASAP so we can actually evaluate her if she doesn't end up going to [facility]

## 2019-07-28 ENCOUNTER — Ambulatory Visit (INDEPENDENT_AMBULATORY_CARE_PROVIDER_SITE_OTHER): Payer: Medicaid Other | Admitting: Family Medicine

## 2019-07-28 ENCOUNTER — Encounter: Payer: Self-pay | Admitting: Family Medicine

## 2019-07-28 ENCOUNTER — Other Ambulatory Visit: Payer: Self-pay

## 2019-07-28 VITALS — BP 135/83 | HR 80 | Temp 98.7°F | Ht 63.0 in | Wt 180.0 lb

## 2019-07-28 DIAGNOSIS — J069 Acute upper respiratory infection, unspecified: Secondary | ICD-10-CM | POA: Diagnosis not present

## 2019-07-28 MED ORDER — ALBUTEROL SULFATE HFA 108 (90 BASE) MCG/ACT IN AERS: 2.0000 | INHALATION_SPRAY | Freq: Four times a day (QID) | RESPIRATORY_TRACT | 2 refills | Status: DC | PRN

## 2019-07-28 MED ORDER — PREDNISONE 20 MG PO TABS: 40.0000 mg | ORAL_TABLET | Freq: Every day | ORAL | 0 refills | Status: DC

## 2019-07-28 MED ORDER — FLUTICASONE PROPIONATE 50 MCG/ACT NA SUSP: 2.0000 | Freq: Two times a day (BID) | NASAL | 6 refills | Status: DC

## 2019-07-28 NOTE — Progress Notes (Signed)
BP 135/83   Pulse 80   Temp 98.7 F (37.1 C) (Oral)   Ht 5\' 3"  (1.6 m)   Wt 180 lb (81.6 kg)   SpO2 97%   BMI 31.89 kg/m    Subjective:    Patient ID: Dawn Taylor, female    DOB: 12/18/1970, 48 y.o.   MRN: [id number]  HPI: ANGENI Taylor is a 48 y.o. female  Chief Complaint  Patient presents with  . Follow-up    pt states that had has no taste or smell for about 3 weeks. states got a negative covid test last Fraday.   Patient presenting today with nearly 3 weeks of sinus pain and pressure, congestion, cough, wheezing worst at night, loss of taste or smell. Was tested for COVID last week and tested negative. Unsure if any sick contacts, no recent travel, no known fevers, chills, body aches. Takes zyrtec D occasionally, and usually keeps albuterol inhalers around but currently out. Hx of allergies and reactive airway.   Relevant past medical, surgical, family and social history reviewed and updated as indicated. Interim medical history since our last visit reviewed. Allergies and medications reviewed and updated.  Review of Systems  Per HPI unless specifically indicated above     Objective:    BP 135/83   Pulse 80   Temp 98.7 F (37.1 C) (Oral)   Ht 5\' 3"  (1.6 m)   Wt 180 lb (81.6 kg)   SpO2 97%   BMI 31.89 kg/m   Wt Readings from Last 3 Encounters:  07/28/19 180 lb (81.6 kg)  03/11/19 185 lb (83.9 kg)  02/11/19 179 lb (81.2 kg)    Physical Exam Vitals and nursing note reviewed.  Constitutional:      General: She is not in acute distress.    Appearance: Normal appearance.  HENT:     Head: Atraumatic.     Right Ear: External ear normal.     Left Ear: External ear normal.     Nose: Nose normal. No congestion.     Comments: Nasal mucosa erythematous    Mouth/Throat:     Mouth: Mucous membranes are moist.     Pharynx: Oropharynx is clear. Posterior oropharyngeal erythema present.  Eyes:     Extraocular Movements: Extraocular movements intact.   Conjunctiva/sclera: Conjunctivae normal.  Cardiovascular:     Comments: Unable to assess via virtual visit Pulmonary:     Effort: Pulmonary effort is normal. No respiratory distress.  Musculoskeletal:        General: Normal range of motion.     Cervical back: Normal range of motion.  Skin:    General: Skin is dry.     Findings: No erythema.  Neurological:     Mental Status: She is alert and oriented to person, place, and time.  Psychiatric:        Mood and Affect: Mood normal.        Thought Content: Thought content normal.        Judgment: Judgment normal.     Results for orders placed or performed during the hospital encounter of 123XX123  Basic metabolic panel  Result Value Ref Range   Sodium 139 135 - 145 mmol/L   Potassium 3.6 3.5 - 5.1 mmol/L   Chloride 106 101 - 111 mmol/L   CO2 24 22 - 32 mmol/L   Glucose, Bld 101 (H) 65 - 99 mg/dL   BUN 12 6 - 20 mg/dL   Creatinine, Ser 0.83 0.44 -  1.00 mg/dL   Calcium 9.2 8.9 - 10.3 mg/dL   GFR calc non Af Amer >60 >60 mL/min   GFR calc Af Amer >60 >60 mL/min   Anion gap 9 5 - 15  CBC  Result Value Ref Range   WBC 12.4 (H) 3.6 - 11.0 K/uL   RBC 4.32 3.80 - 5.20 MIL/uL   Hemoglobin 13.3 12.0 - 16.0 g/dL   HCT 38.6 35.0 - 47.0 %   MCV 89.2 80.0 - 100.0 fL   MCH 30.9 26.0 - 34.0 pg   MCHC 34.6 32.0 - 36.0 g/dL   RDW 13.4 11.5 - 14.5 %   Platelets 271 150 - 440 K/uL  Troponin I  Result Value Ref Range   Troponin I <0.03 <0.03 ng/mL  TSH  Result Value Ref Range   TSH 2.519 0.350 - 4.500 uIU/mL  Fibrin derivatives D-Dimer  Result Value Ref Range   Fibrin derivatives D-dimer ([facility]) 209.81 0.00 - 499.00  Troponin I  Result Value Ref Range   Troponin I <0.03 <0.03 ng/mL      Assessment & Plan:   Problem List Items Addressed This Visit    None    Visit Diagnoses    Upper respiratory tract infection, unspecified type    -  Primary   Tested COVID neg, and out of quarantine window though sxs consistent. Tx with  prednisone, allergy regimen, OTC supportive care. F/u if no improvement       Follow up plan: Return if symptoms worsen or fail to improve.

## 2019-08-02 ENCOUNTER — Other Ambulatory Visit: Payer: Self-pay | Admitting: Family Medicine

## 2019-08-02 ENCOUNTER — Encounter: Payer: Self-pay | Admitting: Family Medicine

## 2019-08-02 MED ORDER — PREDNISONE 20 MG PO TABS: 40.0000 mg | ORAL_TABLET | Freq: Every day | ORAL | 0 refills | Status: DC

## 2019-08-20 ENCOUNTER — Encounter: Payer: Self-pay | Admitting: Family Medicine

## 2019-08-23 ENCOUNTER — Other Ambulatory Visit: Payer: Self-pay | Admitting: Family Medicine

## 2019-08-23 MED ORDER — AZITHROMYCIN 250 MG PO TABS: ORAL_TABLET | ORAL | 0 refills | Status: DC

## 2019-08-27 ENCOUNTER — Encounter: Payer: Self-pay | Admitting: Nurse Practitioner

## 2019-08-27 ENCOUNTER — Ambulatory Visit (INDEPENDENT_AMBULATORY_CARE_PROVIDER_SITE_OTHER): Payer: Medicaid Other | Admitting: Nurse Practitioner

## 2019-08-27 ENCOUNTER — Other Ambulatory Visit: Payer: Self-pay

## 2019-08-27 VITALS — BP 135/82 | HR 79 | Temp 98.7°F | Ht 63.0 in | Wt 180.0 lb

## 2019-08-27 DIAGNOSIS — M542 Cervicalgia: Secondary | ICD-10-CM | POA: Diagnosis not present

## 2019-08-27 MED ORDER — NAPROXEN 500 MG PO TABS: 500.0000 mg | ORAL_TABLET | Freq: Two times a day (BID) | ORAL | 0 refills | Status: DC

## 2019-08-27 MED ORDER — CYCLOBENZAPRINE HCL 5 MG PO TABS: 5.0000 mg | ORAL_TABLET | Freq: Three times a day (TID) | ORAL | 0 refills | Status: DC | PRN

## 2019-08-27 MED ORDER — KETOROLAC TROMETHAMINE 60 MG/2ML IM SOLN: 60.0000 mg | Freq: Once | INTRAMUSCULAR | Status: DC

## 2019-08-27 NOTE — Patient Instructions (Signed)

## 2019-08-27 NOTE — Progress Notes (Signed)
BP 135/82 (BP Location: Left Arm, Patient Position: Sitting, Cuff Size: Normal)   Pulse 79   Temp 98.7 F (37.1 C) (Oral)   Ht 5\' 3"  (1.6 m)   Wt 180 lb (81.6 kg)   SpO2 98%   BMI 31.89 kg/m    Subjective:    Patient ID: Dawn Taylor, female    DOB: 02/02/1971, 49 y.o.   MRN: [id number]  HPI: Dawn Taylor is a 49 y.o. female  Chief Complaint  Patient presents with  . Neck Pain  . Jaw Pain  . Adenopathy  . Loss of Taste/Smell   NECK PAIN Onset: 2 nights ago Location: right side lateral neck    Duration:days Severity: 8/10 Quality: sharp Frequency: constant Radiation: right jaw and head headache  Treatments attempted: Tylenol , Midol, Ibuprofen Relief with NSAIDs?: No Aggravating factors: movement, walking and laying down on right side Alleviating factors: nothing Weakness:  no Paresthesias / decreased sensation:  no  Fevers:  no  Allergies  Allergen Reactions  . Ivp Dye [Iodinated Diagnostic Agents] Anaphylaxis    Pt reports after receiving IV dye she began to get hives all over her body and then her throat began to swell;   . Lisinopril Swelling    angioedema   Outpatient Encounter Medications as of 08/27/2019  Medication Sig Note  . albuterol (VENTOLIN HFA) 108 (90 Base) MCG/ACT inhaler Inhale 2 puffs into the lungs every 6 (six) hours as needed for wheezing or shortness of breath.   Marland Kitchen amLODipine (NORVASC) 5 MG tablet Take 1 tablet (5 mg total) by mouth daily.   Marland Kitchen EPINEPHrine (EPIPEN 2-PAK) 0.3 mg/0.3 mL IJ SOAJ injection Inject 0.3 mLs (0.3 mg total) into the muscle as needed for anaphylaxis.   . fluticasone (FLONASE) 50 MCG/ACT nasal spray Place 2 sprays into both nostrils 2 (two) times daily.   . meclizine (ANTIVERT) 25 MG tablet Take 1 tablet (25 mg total) by mouth 3 (three) times daily as needed for dizziness. 04/26/2019: PRN Only  . promethazine (PHENERGAN) 25 MG tablet Take 1 tablet (25 mg total) by mouth every 8 (eight) hours as needed for nausea  or vomiting. 04/26/2019: PRN only  . cyclobenzaprine (FLEXERIL) 5 MG tablet Take 1 tablet (5 mg total) by mouth 3 (three) times daily as needed for muscle spasms.   . naproxen (NAPROSYN) 500 MG tablet Take 1 tablet (500 mg total) by mouth 2 (two) times daily with a meal.   . [DISCONTINUED] azithromycin (ZITHROMAX) 250 MG tablet Take 2 tabs day one, then 1 tab daily until complete (Patient not taking: Reported on 08/27/2019)   . [DISCONTINUED] predniSONE (DELTASONE) 20 MG tablet Take 2 tablets (40 mg total) by mouth daily with breakfast. (Patient not taking: Reported on 08/27/2019)   . [DISCONTINUED] ketorolac (TORADOL) injection 60 mg     No facility-administered encounter medications on file as of 08/27/2019.   Patient Active Problem List   Diagnosis Date Noted  . Neck pain, acute 08/27/2019  . Essential hypertension 02/14/2019  . Anxiety   . Meniere disease    Past Medical History:  Diagnosis Date  . Anxiety   . Dizziness   . Hypertension   . Meniere disease    Relevant past medical, surgical, family and social history reviewed and updated as indicated. Interim medical history since our last visit reviewed. Allergies and medications reviewed and updated.  Review of Systems  Constitutional: Negative.  Negative for activity change, chills, fatigue and fever.  HENT: Positive  for facial swelling (right side of face/neck). Negative for congestion, dental problem, ear discharge, ear pain, rhinorrhea, sinus pressure, sinus pain, sneezing, sore throat (feels like lump on right side of throat when swallowing), trouble swallowing and voice change.   Respiratory: Negative.  Negative for cough, shortness of breath and wheezing.   Cardiovascular: Negative.  Negative for chest pain and palpitations.  Musculoskeletal: Positive for neck pain.  Skin: Negative.  Negative for color change, pallor and rash.  Neurological: Negative.  Negative for dizziness, tremors, facial asymmetry, weakness, numbness and  headaches.  Hematological: Positive for adenopathy.  Psychiatric/Behavioral: Negative for confusion and sleep disturbance. The patient is nervous/anxious.     Per HPI unless specifically indicated above     Objective:    BP 135/82 (BP Location: Left Arm, Patient Position: Sitting, Cuff Size: Normal)   Pulse 79   Temp 98.7 F (37.1 C) (Oral)   Ht 5\' 3"  (1.6 m)   Wt 180 lb (81.6 kg)   SpO2 98%   BMI 31.89 kg/m   Wt Readings from Last 3 Encounters:  08/27/19 180 lb (81.6 kg)  07/28/19 180 lb (81.6 kg)  03/11/19 185 lb (83.9 kg)    Physical Exam Vitals and nursing note reviewed.  Constitutional:      General: She is not in acute distress.    Appearance: Normal appearance. She is not ill-appearing or toxic-appearing.  HENT:     Head: Normocephalic.     Right Ear: Tympanic membrane, ear canal and external ear normal.     Left Ear: Tympanic membrane, ear canal and external ear normal.     Nose: Nose normal. No congestion or rhinorrhea.     Mouth/Throat:     Mouth: Mucous membranes are moist.     Pharynx: Oropharynx is clear. No oropharyngeal exudate or posterior oropharyngeal erythema.  Eyes:     General: No scleral icterus.    Extraocular Movements: Extraocular movements intact.     Conjunctiva/sclera: Conjunctivae normal.  Neck:     Thyroid: No thyromegaly.     Trachea: Trachea normal.  Cardiovascular:     Rate and Rhythm: Normal rate.     Heart sounds: Normal heart sounds. No murmur.  Pulmonary:     Effort: Pulmonary effort is normal. No respiratory distress.  Musculoskeletal:        General: No swelling or tenderness. Normal range of motion.     Cervical back: Neck supple. No rigidity. Pain with movement (with movement to the left) and muscular tenderness present.     Right lower leg: No edema.     Left lower leg: No edema.  Lymphadenopathy:     Cervical: Cervical adenopathy present.     Right cervical: Superficial cervical adenopathy present.  Skin:    General:  Skin is warm and dry.     Coloration: Skin is not pale.  Neurological:     General: No focal deficit present.     Mental Status: She is alert and oriented to person, place, and time.     Motor: No weakness.     Gait: Gait normal.  Psychiatric:        Mood and Affect: Mood normal.        Behavior: Behavior normal.        Thought Content: Thought content normal.        Judgment: Judgment normal.      Assessment & Plan:   Problem List Items Addressed This Visit      Other  Neck pain, acute - Primary    No red flags on examination including no fevers paresthesia, numbness/tingling, or incontinence and neck supple.  EKG performed to rule out cardiac etiology, reading was similar to most recent EKG in 2018.  Pain is likely caused by muscle strain in neck.  Patient declined Toradol in office today.  Will send in Rx for muscle relaxer and antiinflammatory.  Neck exercises sent to patient via mychart.  Patient advised to return to clinic if symptoms persist or worsen in the next week.      Relevant Medications   naproxen (NAPROSYN) 500 MG tablet   cyclobenzaprine (FLEXERIL) 5 MG tablet   Other Relevant Orders   EKG 12-Lead (Completed)       Follow up plan: Return if symptoms worsen or fail to improve.

## 2019-08-27 NOTE — Progress Notes (Signed)
Patient was to get a Toradol injection 60mg /2 mL, but patient declined and stated she thought she had a reaction to it in the past. Medication discarded of.  [id number] EXP: 10/2020

## 2019-08-27 NOTE — Assessment & Plan Note (Addendum)
No red flags on examination including no fevers paresthesia, numbness/tingling, or incontinence and neck supple.  EKG performed to rule out cardiac etiology, reading was similar to most recent EKG in 2018.  Pain is likely caused by muscle strain in neck.  Patient declined Toradol in office today.  Will send in Rx for muscle relaxer and antiinflammatory.  Neck exercises sent to patient via mychart.  Patient advised to return to clinic if symptoms persist or worsen in the next week.

## 2019-09-15 ENCOUNTER — Ambulatory Visit: Payer: Medicaid Other | Admitting: Family Medicine

## 2020-03-14 ENCOUNTER — Ambulatory Visit: Payer: Medicaid Other | Admitting: Nurse Practitioner

## 2020-03-29 ENCOUNTER — Other Ambulatory Visit: Payer: Self-pay

## 2020-03-29 ENCOUNTER — Ambulatory Visit (INDEPENDENT_AMBULATORY_CARE_PROVIDER_SITE_OTHER): Payer: Medicaid Other | Admitting: Family Medicine

## 2020-03-29 ENCOUNTER — Encounter: Payer: Self-pay | Admitting: Family Medicine

## 2020-03-29 VITALS — BP 145/85 | HR 96 | Temp 98.4°F | Wt 192.0 lb

## 2020-03-29 DIAGNOSIS — R221 Localized swelling, mass and lump, neck: Secondary | ICD-10-CM

## 2020-03-29 MED ORDER — AMOXICILLIN-POT CLAVULANATE 875-125 MG PO TABS: 1.0000 | ORAL_TABLET | Freq: Two times a day (BID) | ORAL | 0 refills | Status: DC

## 2020-03-29 NOTE — Progress Notes (Signed)
BP (!) 145/85   Pulse 96   Temp 98.4 F (36.9 C) (Oral)   Wt 192 lb (87.1 kg)   SpO2 98%   BMI 34.01 kg/m    Subjective:    Patient ID: Dawn Taylor, female    DOB: [birth year], 49 y.o.   MRN: [id number]  HPI: Dawn Taylor is a 49 y.o. female  Chief Complaint  Patient presents with  . Edema    bilateral swelling glands and painfull x over a week   Right side of neck swollen, tender with swollen glands and pain with swallowing. Seems to radiate from right ear down to base of neck. This has been present for about a week now. Denies fever, chills, sweats, recent illnesses, sick contacts. Has not been trying anything OTC for relief.   Relevant past medical, surgical, family and social history reviewed and updated as indicated. Interim medical history since our last visit reviewed. Allergies and medications reviewed and updated.  Review of Systems  Per HPI unless specifically indicated above     Objective:    BP (!) 145/85   Pulse 96   Temp 98.4 F (36.9 C) (Oral)   Wt 192 lb (87.1 kg)   SpO2 98%   BMI 34.01 kg/m   Wt Readings from Last 3 Encounters:  03/29/20 192 lb (87.1 kg)  08/27/19 180 lb (81.6 kg)  07/28/19 180 lb (81.6 kg)    Physical Exam Vitals and nursing note reviewed.  Constitutional:      Appearance: Normal appearance. She is not ill-appearing.  HENT:     Head: Atraumatic.     Right Ear: Tympanic membrane normal.     Left Ear: Tympanic membrane normal.     Nose: Nose normal.     Mouth/Throat:     Mouth: Mucous membranes are moist.     Pharynx: Oropharynx is clear. Posterior oropharyngeal erythema present.  Eyes:     Extraocular Movements: Extraocular movements intact.     Conjunctiva/sclera: Conjunctivae normal.  Cardiovascular:     Rate and Rhythm: Normal rate and regular rhythm.     Heart sounds: Normal heart sounds.  Pulmonary:     Effort: Pulmonary effort is normal.     Breath sounds: Normal breath sounds.  Musculoskeletal:         General: Normal range of motion.     Cervical back: Normal range of motion and neck supple.  Lymphadenopathy:     Cervical: Cervical adenopathy (right, multiple tender palpable inflamed lymph nodes) present.  Skin:    General: Skin is warm and dry.  Neurological:     Mental Status: She is alert and oriented to person, place, and time.  Psychiatric:        Mood and Affect: Mood normal.        Thought Content: Thought content normal.        Judgment: Judgment normal.     Results for orders placed or performed during the hospital encounter of 89/37/34  Basic metabolic panel  Result Value Ref Range   Sodium 139 135 - 145 mmol/L   Potassium 3.6 3.5 - 5.1 mmol/L   Chloride 106 101 - 111 mmol/L   CO2 24 22 - 32 mmol/L   Glucose, Bld 101 (H) 65 - 99 mg/dL   BUN 12 6 - 20 mg/dL   Creatinine, Ser 0.83 0.44 - 1.00 mg/dL   Calcium 9.2 8.9 - 10.3 mg/dL   GFR calc non Af Amer >60 >60 mL/min  GFR calc Af Amer >60 >60 mL/min   Anion gap 9 5 - 15  CBC  Result Value Ref Range   WBC 12.4 (H) 3.6 - 11.0 K/uL   RBC 4.32 3.80 - 5.20 MIL/uL   Hemoglobin 13.3 12.0 - 16.0 g/dL   HCT 38.6 35 - 47 %   MCV 89.2 80.0 - 100.0 fL   MCH 30.9 26.0 - 34.0 pg   MCHC 34.6 32.0 - 36.0 g/dL   RDW 13.4 11.5 - 14.5 %   Platelets 271 150 - 440 K/uL  Troponin I  Result Value Ref Range   Troponin I <0.03 <0.03 ng/mL  TSH  Result Value Ref Range   TSH 2.519 0.350 - 4.500 uIU/mL  Fibrin derivatives D-Dimer  Result Value Ref Range   Fibrin derivatives D-dimer ([facility]) 209.81 0.00 - 499.00  Troponin I  Result Value Ref Range   Troponin I <0.03 <0.03 ng/mL      Assessment & Plan:   Problem List Items Addressed This Visit    None    Visit Diagnoses    Neck swelling    -  Primary   reactive vs infectious. allergy regimen, augmentin and NSAIDs prn. f/u if worsening or not improving       Follow up plan: Return if symptoms worsen or fail to improve.

## 2020-03-30 ENCOUNTER — Ambulatory Visit: Payer: Medicaid Other | Admitting: Nurse Practitioner

## 2020-03-31 ENCOUNTER — Other Ambulatory Visit: Payer: Self-pay | Admitting: Family Medicine

## 2020-03-31 ENCOUNTER — Encounter: Payer: Self-pay | Admitting: Family Medicine

## 2020-03-31 MED ORDER — SCOPOLAMINE 1 MG/3DAYS TD PT72: 1.0000 | MEDICATED_PATCH | TRANSDERMAL | 1 refills | Status: DC

## 2020-05-18 ENCOUNTER — Other Ambulatory Visit: Payer: Self-pay | Admitting: Nurse Practitioner

## 2020-05-18 DIAGNOSIS — M542 Cervicalgia: Secondary | ICD-10-CM

## 2020-05-18 NOTE — Telephone Encounter (Signed)
Please Advise. Last office visit 03/2020.  KP

## 2020-05-21 MED ORDER — NAPROXEN 500 MG PO TABS: 500.0000 mg | ORAL_TABLET | Freq: Two times a day (BID) | ORAL | 0 refills | Status: DC

## 2020-08-16 ENCOUNTER — Other Ambulatory Visit: Payer: Self-pay | Admitting: Family Medicine

## 2020-08-16 ENCOUNTER — Encounter: Payer: Self-pay | Admitting: Family Medicine

## 2020-08-16 ENCOUNTER — Telehealth (INDEPENDENT_AMBULATORY_CARE_PROVIDER_SITE_OTHER): Payer: Medicaid Other | Admitting: Family Medicine

## 2020-08-16 DIAGNOSIS — J0141 Acute recurrent pansinusitis: Secondary | ICD-10-CM | POA: Diagnosis not present

## 2020-08-16 DIAGNOSIS — J329 Chronic sinusitis, unspecified: Secondary | ICD-10-CM | POA: Insufficient documentation

## 2020-08-16 MED ORDER — METHYLPREDNISOLONE 4 MG PO TBPK: ORAL_TABLET | ORAL | 0 refills | Status: DC

## 2020-08-16 NOTE — Telephone Encounter (Signed)
   Notes to clinic: Patient was seen today and requesting the flonase Review for refill  Requested Prescriptions  Pending Prescriptions Disp Refills   fluticasone (FLONASE) 50 MCG/ACT nasal spray [Pharmacy Med Name: FLUTICASONE 50MCG NASAL SP (120) RX] 16 g 6    Sig: SHAKE LIQUID AND USE 2 SPRAYS IN EACH NOSTRIL TWICE DAILY      Ear, Nose, and Throat: Nasal Preparations - Corticosteroids Passed - 08/16/2020  1:15 PM      Passed - Valid encounter within last 12 months    Recent Outpatient Visits           Today Acute recurrent pansinusitis   [facility] Myles Gip, DO   4 months ago Neck swelling   [place], [place]   11 months ago Neck pain, acute   [facility] Eulogio Bear, NP   1 year ago Upper respiratory tract infection, unspecified type   [place], Lilia Argue, [place]   1 year ago Allergic dermatitis   [facility] Merrie Roof [place], [place]

## 2020-08-16 NOTE — Patient Instructions (Signed)
It was great to see you!  Our plans for today:  - Take the steroids as prescribed. - Come back to see [place] if your symptoms worsen or develop fevers.   Take care and seek immediate care sooner if you develop any concerns.   Dr. Ky Barban

## 2020-08-16 NOTE — Progress Notes (Signed)
Virtual Visit via Video Note  I connected with Dawn Taylor on 08/16/20 at 11:20 AM EST by a video enabled telemedicine application and verified that I am speaking with the correct person using two identifiers.  Location: Patient: home Provider: [facility]   I discussed the limitations of evaluation and management by telemedicine and the availability of in person appointments. The patient expressed understanding and agreed to proceed.  History of Present Illness:  UPPER RESPIRATORY TRACT INFECTION - symptom onset 08/14/20. - home COVID test negative  Worst symptom: Fever: no Cough: no Shortness of breath: no Wheezing: no Chest pain: no Chest tightness: no Chest congestion: no Nasal congestion: no Runny nose: no Sneezing: no Sore throat: no Sinus pressure: yes Headache: yes Face pain: yes Ear pain: no   Ear pressure: fluctuating bilateral Sick contacts: no Context: stable Recurrent sinusitis: yes, about twice per year Relief with OTC cold/cough medications: no  Treatments attempted: tylenol, ibuprofen, zyrtec-D, flonase and cold/sinus   Observations/Objective:  Patient had trouble connecting to video visit, entirety of visit conducted over the phone.  Speaks in full sentences, no respiratory distress. Congested sounding.  Assessment and Plan:  Sinusitis Likely viral vs allergic, COVID negative. No sx to concern for bacterial etiology. Will provide steroid dose pack given no improvement with OTC decongestant. F/u if no better or if symptoms worsen or develops fevers.   I discussed the assessment and treatment plan with the patient. The patient was provided an opportunity to ask questions and all were answered. The patient agreed with the plan and demonstrated an understanding of the instructions.   The patient was advised to call back or seek an in-person evaluation if the symptoms worsen or if the condition fails to improve as anticipated.  I provided 8 minutes of  non-face-to-face time during this encounter.   Myles Gip, DO

## 2020-08-16 NOTE — Assessment & Plan Note (Addendum)
Likely viral vs allergic, COVID negative. No sx to concern for bacterial etiology. Will provide steroid dose pack given no improvement with OTC decongestant. F/u if no better or if symptoms worsen or develops fevers.

## 2020-08-18 ENCOUNTER — Other Ambulatory Visit: Payer: Self-pay

## 2020-08-18 ENCOUNTER — Other Ambulatory Visit: Payer: Self-pay | Admitting: Family Medicine

## 2020-08-18 ENCOUNTER — Telehealth: Payer: Self-pay

## 2020-08-18 ENCOUNTER — Other Ambulatory Visit: Payer: Medicaid Other

## 2020-08-18 DIAGNOSIS — Z1152 Encounter for screening for COVID-19: Secondary | ICD-10-CM

## 2020-08-18 MED ORDER — AMOXICILLIN 875 MG PO TABS: 875.0000 mg | ORAL_TABLET | Freq: Two times a day (BID) | ORAL | 0 refills | Status: AC

## 2020-08-18 NOTE — Telephone Encounter (Signed)
Copied from [place] [id number]. Topic: General - Other >> Aug 18, 2020  1:29 PM Rainey Pines A wrote: Patient stated that she is still feeling bad since her virtual visit and wants to know if she can come to office to be covid tested today. Please advise  Called pt unable to lvm to make apt for covid in our parking lot.

## 2020-08-21 ENCOUNTER — Other Ambulatory Visit: Payer: Self-pay

## 2020-08-21 ENCOUNTER — Telehealth (INDEPENDENT_AMBULATORY_CARE_PROVIDER_SITE_OTHER): Payer: Medicaid Other | Admitting: Family Medicine

## 2020-08-21 ENCOUNTER — Encounter: Payer: Self-pay | Admitting: Family Medicine

## 2020-08-21 DIAGNOSIS — R059 Cough, unspecified: Secondary | ICD-10-CM

## 2020-08-21 LAB — NOVEL CORONAVIRUS, NAA: SARS-CoV-2, NAA: NOT DETECTED

## 2020-08-21 MED ORDER — PREDNISONE 10 MG PO TABS: ORAL_TABLET | ORAL | 0 refills | Status: DC

## 2020-08-21 MED ORDER — ALBUTEROL SULFATE HFA 108 (90 BASE) MCG/ACT IN AERS: 2.0000 | INHALATION_SPRAY | Freq: Four times a day (QID) | RESPIRATORY_TRACT | 2 refills | Status: DC | PRN

## 2020-08-21 MED ORDER — BENZONATATE 200 MG PO CAPS: 200.0000 mg | ORAL_CAPSULE | Freq: Two times a day (BID) | ORAL | 0 refills | Status: DC | PRN

## 2020-08-21 NOTE — Progress Notes (Signed)
There were no vitals taken for this visit.   Subjective:    Patient ID: Dawn Taylor, female    DOB: 1971-07-19, 50 y.o.   MRN: [id number]  HPI: Dawn Taylor is a 50 y.o. female  Chief Complaint  Patient presents with  . Cough    Pt states she has had a cough and headache since Friday, Covid was negative. States she is still taking amoxicillin but Prednisone is almost finished.    UPPER RESPIRATORY TRACT INFECTION Duration: about a week Worst symptom: cough Fever: yes- 102 Cough: yes Shortness of breath: no Wheezing: yes Chest pain: no Chest tightness: no Chest congestion: no Nasal congestion: yes Runny nose: yes Post nasal drip: no Sneezing: no Sore throat: no Swollen glands: no Sinus pressure: yes Headache: yes Face pain: yes Toothache: no Ear pain: no  Ear pressure: no  Eyes red/itching:no Eye drainage/crusting: no  Vomiting: no Rash: no Fatigue: yes Sick contacts: no Strep contacts: no  Context: worse Recurrent sinusitis: no Relief with OTC cold/cough medications: no  Treatments attempted: predisone, amoxicillin   Relevant past medical, surgical, family and social history reviewed and updated as indicated. Interim medical history since our last visit reviewed. Allergies and medications reviewed and updated.  Review of Systems  Constitutional: Positive for fatigue and fever. Negative for activity change, appetite change, chills, diaphoresis and unexpected weight change.  HENT: Positive for congestion, postnasal drip and sinus pain. Negative for dental problem, drooling, ear discharge, ear pain, facial swelling, hearing loss, mouth sores, nosebleeds, rhinorrhea, sinus pressure, sneezing, sore throat, tinnitus, trouble swallowing and voice change.   Respiratory: Positive for cough and chest tightness. Negative for apnea, choking, shortness of breath, wheezing and stridor.   Cardiovascular: Negative.   Gastrointestinal: Negative.    Psychiatric/Behavioral: Negative.     Per HPI unless specifically indicated above     Objective:    There were no vitals taken for this visit.  Wt Readings from Last 3 Encounters:  03/29/20 192 lb (87.1 kg)  08/27/19 180 lb (81.6 kg)  07/28/19 180 lb (81.6 kg)    Physical Exam Vitals and nursing note reviewed.  Pulmonary:     Effort: Pulmonary effort is normal. No respiratory distress.     Comments: Speaking in full sentences Neurological:     Mental Status: She is alert.  Psychiatric:        Mood and Affect: Mood normal.        Behavior: Behavior normal.        Thought Content: Thought content normal.        Judgment: Judgment normal.     Results for orders placed or performed in visit on 08/18/20  Novel Coronavirus, NAA ([facility])   Specimen: Nasopharyngeal(NP) swabs in vial transport medium  Result Value Ref Range   SARS-CoV-2, NAA Not Detected Not Detected      Assessment & Plan:   Problem List Items Addressed This Visit   None   Visit Diagnoses    Cough    -  Primary   COVID negative. Will get CXR and start 12 day steroid taper. Tessalon and albuterol PRN. Call if not getting better or getting worse.    Relevant Orders   DG Chest 2 View       Follow up plan: Return if symptoms worsen or fail to improve.    . This visit was completed via telephone due to the restrictions of the COVID-19 pandemic. All issues as above were discussed and addressed  but no physical exam was performed. If it was felt that the patient should be evaluated in the office, they were directed there. The patient verbally consented to this visit. Patient was unable to complete an audio/visual visit due to Lack of equipment. Due to the catastrophic nature of the COVID-19 pandemic, this visit was done through audio contact only. . Location of the patient: home . Location of the provider: home . Those involved with this call:  . Provider: Park Liter, DO . CMA: Yvonna Alanis,  [place] . Front Desk/Registration: Jill Side  . Time spent on call: 21 minutes on the phone discussing health concerns. 30 minutes total spent in review of patient's record and preparation of their chart.

## 2020-10-13 ENCOUNTER — Encounter: Payer: Self-pay | Admitting: Nurse Practitioner

## 2020-10-13 ENCOUNTER — Ambulatory Visit (INDEPENDENT_AMBULATORY_CARE_PROVIDER_SITE_OTHER): Payer: Medicaid Other | Admitting: Nurse Practitioner

## 2020-10-13 ENCOUNTER — Other Ambulatory Visit: Payer: Self-pay

## 2020-10-13 VITALS — BP 159/97 | HR 102 | Temp 98.9°F | Wt 180.0 lb

## 2020-10-13 DIAGNOSIS — H8102 Meniere's disease, left ear: Secondary | ICD-10-CM

## 2020-10-13 MED ORDER — HYDROCHLOROTHIAZIDE 12.5 MG PO CAPS: 12.5000 mg | ORAL_CAPSULE | Freq: Every day | ORAL | 0 refills | Status: DC

## 2020-10-13 MED ORDER — METHYLPREDNISOLONE 4 MG PO TBPK: ORAL_TABLET | ORAL | 0 refills | Status: DC

## 2020-10-13 NOTE — Assessment & Plan Note (Signed)
Chronic.  Uncontrolled.  Patient previously on HCTZ and ativan PRN for symptoms.  Patient's ENT retired and she hasn't established with a new one.  Out of the medications for awhile.  Will begin HCTZ 12.5mg .  Will increase at next visit as needed. Can do ativan PRN if symptoms are still uncontrolled.  Patient can tolerate zyrtec.  Recommend daily.  Follow up in 1 month.

## 2020-10-13 NOTE — Progress Notes (Signed)
BP (!) 159/97   Pulse (!) 102   Temp 98.9 F (37.2 C)   Wt 180 lb (81.6 kg)   SpO2 98%   BMI 31.89 kg/m    Subjective:    Patient ID: Dawn Taylor, female    DOB: [birth year], 50 y.o.   MRN: [id number]  HPI: Dawn Taylor is a 50 y.o. female  Chief Complaint  Patient presents with  . Dizziness  . Sleeping Problem   DIZZINESS Duration: weeks Description of symptoms: room spinning Duration of episode: hours Dizziness frequency: recurrent Provoking factors: laying down and moving. Aggravating factors:  movement Triggered by rolling over in bed: yes Triggered by bending over: yes Aggravated by head movement: yes Aggravated by exertion, coughing, loud noises: no Recent head injury: no Recent or current viral symptoms: no History of vasovagal episodes: no Nausea: yes Vomiting: no Tinnitus: yes Hearing loss: yes Aural fullness: no Headache: no Photophobia/phonophobia: no Unsteady gait: no Postural instability: no Diplopia, dysarthria, dysphagia or weakness: no Related to exertion: no Pallor: no Diaphoresis: no Dyspnea: no Chest pain: no   Relevant past medical, surgical, family and social history reviewed and updated as indicated. Interim medical history since our last visit reviewed. Allergies and medications reviewed and updated.  Review of Systems  Neurological: Positive for dizziness.    Per HPI unless specifically indicated above     Objective:    BP (!) 159/97   Pulse (!) 102   Temp 98.9 F (37.2 C)   Wt 180 lb (81.6 kg)   SpO2 98%   BMI 31.89 kg/m   Wt Readings from Last 3 Encounters:  10/13/20 180 lb (81.6 kg)  03/29/20 192 lb (87.1 kg)  08/27/19 180 lb (81.6 kg)    Physical Exam Vitals and nursing note reviewed.  Constitutional:      General: She is not in acute distress.    Appearance: Normal appearance. She is normal weight. She is not ill-appearing, toxic-appearing or diaphoretic.  HENT:     Head: Normocephalic.     Right  Ear: External ear normal. A middle ear effusion is present.     Left Ear: External ear normal. A middle ear effusion is present.     Nose: Nose normal.     Mouth/Throat:     Mouth: Mucous membranes are moist.     Pharynx: Oropharynx is clear.  Eyes:     General:        Right eye: No discharge.        Left eye: No discharge.     Extraocular Movements: Extraocular movements intact.     Conjunctiva/sclera: Conjunctivae normal.     Pupils: Pupils are equal, round, and reactive to light.  Cardiovascular:     Rate and Rhythm: Normal rate and regular rhythm.     Heart sounds: No murmur heard.   Pulmonary:     Effort: Pulmonary effort is normal. No respiratory distress.     Breath sounds: Normal breath sounds. No wheezing or rales.  Musculoskeletal:     Cervical back: Normal range of motion and neck supple.  Skin:    General: Skin is warm and dry.     Capillary Refill: Capillary refill takes less than 2 seconds.  Neurological:     General: No focal deficit present.     Mental Status: She is alert and oriented to person, place, and time. Mental status is at baseline.  Psychiatric:        Mood and Affect:  Mood normal.        Behavior: Behavior normal.        Thought Content: Thought content normal.        Judgment: Judgment normal.     Results for orders placed or performed in visit on 08/18/20  Novel Coronavirus, NAA ([facility])   Specimen: Nasopharyngeal(NP) swabs in vial transport medium  Result Value Ref Range   SARS-CoV-2, NAA Not Detected Not Detected      Assessment & Plan:   Problem List Items Addressed This Visit   None   Visit Diagnoses    Meniere disease, left    -  Primary   Relevant Medications   hydrochlorothiazide (MICROZIDE) 12.5 MG capsule       Follow up plan: Return in about 1 month (around 11/13/2020) for dizziness from menier's.

## 2020-11-12 ENCOUNTER — Other Ambulatory Visit: Payer: Self-pay | Admitting: Nurse Practitioner

## 2020-11-12 DIAGNOSIS — H8102 Meniere's disease, left ear: Secondary | ICD-10-CM

## 2020-11-12 NOTE — Telephone Encounter (Signed)
Pt has appt tomorrow for f/u.

## 2020-11-13 ENCOUNTER — Ambulatory Visit: Payer: Medicaid Other | Admitting: Nurse Practitioner

## 2020-11-13 NOTE — Progress Notes (Signed)
BP (!) 160/90   Pulse 88   Temp 98.2 F (36.8 C)   Wt 180 lb (81.6 kg)   SpO2 96%   BMI 31.89 kg/m    Subjective:    Patient ID: Dawn Taylor, female    DOB: [birth year], 50 y.o.   MRN: [id number]  HPI: Dawn Taylor is a 50 y.o. female  Chief Complaint  Patient presents with  . Dizziness  . Hypertension    Needs refills has been out of medication   DIZZINESS Patient is here for follow up on her Menieres.  She is taking the hydrochlorothiazide daily and it is helping.  Patient is only able to take the Meclizine once in awhile due to the side effects. She Typically uses the patch more to help with symptoms.  Patient states the symptoms have improved from last visit however they are always there. Patient did not complete the course of steroids because she went on a trip and left them behind.  She took two days worth.  Felt like they were helping while she was on them.  She also feels like after taking a course of antibiotics the symptoms improve for about 3 months. Duration: weeks Description of symptoms: room spinning Duration of episode: hours Dizziness frequency: recurrent Provoking factors: laying down and moving. Aggravating factors:  movement Triggered by rolling over in bed: yes Triggered by bending over: yes Aggravated by head movement: yes Aggravated by exertion, coughing, loud noises: no Recent head injury: no Recent or current viral symptoms: no History of vasovagal episodes: no Nausea: yes Vomiting: no Tinnitus: yes Hearing loss: yes Aural fullness: no Headache: no Photophobia/phonophobia: no Unsteady gait: no Postural instability: no Diplopia, dysarthria, dysphagia or weakness: no Related to exertion: no Pallor: no Diaphoresis: no Dyspnea: no Chest pain: no  GERD Patient states she has been having bad reflux.  She has tried many OTC products but they are not improving her symptoms.  Patient states she is waking up at night choking from the  reflux.  HYPERTENSION Elevated at visit today.  Patient reports she has been out of her blood pressure medication.  She needs a refill at visit today.  Denies HA, CP, SOB, palpitations, visual changes, and lower extremity swelling.    Relevant past medical, surgical, family and social history reviewed and updated as indicated. Interim medical history since our last visit reviewed. Allergies and medications reviewed and updated.  Review of Systems  Eyes: Negative for visual disturbance.  Respiratory: Negative for cough, chest tightness and shortness of breath.   Cardiovascular: Negative for chest pain, palpitations and leg swelling.  Gastrointestinal:       Reflux.  Neurological: Positive for dizziness. Negative for headaches.    Per HPI unless specifically indicated above     Objective:    BP (!) 160/90   Pulse 88   Temp 98.2 F (36.8 C)   Wt 180 lb (81.6 kg)   SpO2 96%   BMI 31.89 kg/m   Wt Readings from Last 3 Encounters:  11/14/20 180 lb (81.6 kg)  10/13/20 180 lb (81.6 kg)  03/29/20 192 lb (87.1 kg)    Physical Exam Vitals and nursing note reviewed.  Constitutional:      General: She is not in acute distress.    Appearance: Normal appearance. She is normal weight. She is not ill-appearing, toxic-appearing or diaphoretic.  HENT:     Head: Normocephalic.     Right Ear: External ear normal. A middle ear effusion  is present. Tympanic membrane is not erythematous.     Left Ear: External ear normal. A middle ear effusion is present. Tympanic membrane is not erythematous.     Nose: Nose normal.     Mouth/Throat:     Mouth: Mucous membranes are moist.     Pharynx: Oropharynx is clear.  Eyes:     General:        Right eye: No discharge.        Left eye: No discharge.     Extraocular Movements: Extraocular movements intact.     Conjunctiva/sclera: Conjunctivae normal.     Pupils: Pupils are equal, round, and reactive to light.  Cardiovascular:     Rate and Rhythm:  Normal rate and regular rhythm.     Heart sounds: No murmur heard.   Pulmonary:     Effort: Pulmonary effort is normal. No respiratory distress.     Breath sounds: Normal breath sounds. No wheezing or rales.  Musculoskeletal:     Cervical back: Normal range of motion and neck supple.  Skin:    General: Skin is warm and dry.     Capillary Refill: Capillary refill takes less than 2 seconds.  Neurological:     General: No focal deficit present.     Mental Status: She is alert and oriented to person, place, and time. Mental status is at baseline.  Psychiatric:        Mood and Affect: Mood normal.        Behavior: Behavior normal.        Thought Content: Thought content normal.        Judgment: Judgment normal.     Results for orders placed or performed in visit on 08/18/20  Novel Coronavirus, NAA ([facility])   Specimen: Nasopharyngeal(NP) swabs in vial transport medium  Result Value Ref Range   SARS-CoV-2, NAA Not Detected Not Detected      Assessment & Plan:   Problem List Items Addressed This Visit      Cardiovascular and Mediastinum   Essential hypertension    Chronic.  Uncontrolled.  Refills sent today.  Will draw labs at next visit.  Follow up in 1 month.      Relevant Medications   hydrochlorothiazide (MICROZIDE) 12.5 MG capsule   amLODipine (NORVASC) 5 MG tablet     Nervous and Auditory   Meniere disease, left - Primary    Chronic.  Uncontrolled.  Complete course of antibiotics and steroids.  Discussed increasing to HCTZ 25mg .  Patient would like to hold off for now.  Continue with Meclizine and Scolpamine PRN.  Patient can tolerate zyrtec.  Recommend daily.  Follow up in 1 month.      Relevant Medications   hydrochlorothiazide (MICROZIDE) 12.5 MG capsule    Other Visit Diagnoses    Gastroesophageal reflux disease, unspecified whether esophagitis present       Recommend using Protonix daily to help control symptoms.  If symptoms persist, will send patient ot GI.    Relevant Medications   scopolamine (TRANSDERM-SCOP, 1.5 MG,) 1 MG/3DAYS   pantoprazole (PROTONIX) 20 MG tablet       Follow up plan: Return in about 1 month (around 12/14/2020) for BP Check.

## 2020-11-13 NOTE — Telephone Encounter (Signed)
Pt has apt on 11/13/2020

## 2020-11-13 NOTE — Progress Notes (Deleted)
There were no vitals taken for this visit.   Subjective:    Patient ID: Dawn Taylor, female    DOB: 11-26-1970, 50 y.o.   MRN: [id number]  HPI: Dawn Taylor is a 50 y.o. female  No chief complaint on file.  DIZZINESS Duration: weeks Description of symptoms: room spinning Duration of episode: hours Dizziness frequency: recurrent Provoking factors: laying down and moving. Aggravating factors:  movement Triggered by rolling over in bed: yes Triggered by bending over: yes Aggravated by head movement: yes Aggravated by exertion, coughing, loud noises: no Recent head injury: no Recent or current viral symptoms: no History of vasovagal episodes: no Nausea: yes Vomiting: no Tinnitus: yes Hearing loss: yes Aural fullness: no Headache: no Photophobia/phonophobia: no Unsteady gait: no Postural instability: no Diplopia, dysarthria, dysphagia or weakness: no Related to exertion: no Pallor: no Diaphoresis: no Dyspnea: no Chest pain: no   Relevant past medical, surgical, family and social history reviewed and updated as indicated. Interim medical history since our last visit reviewed. Allergies and medications reviewed and updated.  Review of Systems  Neurological: Positive for dizziness.    Per HPI unless specifically indicated above     Objective:    There were no vitals taken for this visit.  Wt Readings from Last 3 Encounters:  10/13/20 180 lb (81.6 kg)  03/29/20 192 lb (87.1 kg)  08/27/19 180 lb (81.6 kg)    Physical Exam Vitals and nursing note reviewed.  Constitutional:      General: She is not in acute distress.    Appearance: Normal appearance. She is normal weight. She is not ill-appearing, toxic-appearing or diaphoretic.  HENT:     Head: Normocephalic.     Right Ear: External ear normal. A middle ear effusion is present.     Left Ear: External ear normal. A middle ear effusion is present.     Nose: Nose normal.     Mouth/Throat:     Mouth:  Mucous membranes are moist.     Pharynx: Oropharynx is clear.  Eyes:     General:        Right eye: No discharge.        Left eye: No discharge.     Extraocular Movements: Extraocular movements intact.     Conjunctiva/sclera: Conjunctivae normal.     Pupils: Pupils are equal, round, and reactive to light.  Cardiovascular:     Rate and Rhythm: Normal rate and regular rhythm.     Heart sounds: No murmur heard.   Pulmonary:     Effort: Pulmonary effort is normal. No respiratory distress.     Breath sounds: Normal breath sounds. No wheezing or rales.  Musculoskeletal:     Cervical back: Normal range of motion and neck supple.  Skin:    General: Skin is warm and dry.     Capillary Refill: Capillary refill takes less than 2 seconds.  Neurological:     General: No focal deficit present.     Mental Status: She is alert and oriented to person, place, and time. Mental status is at baseline.  Psychiatric:        Mood and Affect: Mood normal.        Behavior: Behavior normal.        Thought Content: Thought content normal.        Judgment: Judgment normal.     Results for orders placed or performed in visit on 08/18/20  Novel Coronavirus, NAA ([facility])   Specimen: Nasopharyngeal(NP)  swabs in vial transport medium  Result Value Ref Range   SARS-CoV-2, NAA Not Detected Not Detected      Assessment & Plan:   Problem List Items Addressed This Visit   None      Follow up plan: No follow-ups on file.

## 2020-11-14 ENCOUNTER — Ambulatory Visit (INDEPENDENT_AMBULATORY_CARE_PROVIDER_SITE_OTHER): Payer: Medicaid Other | Admitting: Nurse Practitioner

## 2020-11-14 ENCOUNTER — Other Ambulatory Visit: Payer: Self-pay

## 2020-11-14 ENCOUNTER — Encounter: Payer: Self-pay | Admitting: Nurse Practitioner

## 2020-11-14 VITALS — BP 160/90 | HR 88 | Temp 98.2°F | Wt 180.0 lb

## 2020-11-14 DIAGNOSIS — I10 Essential (primary) hypertension: Secondary | ICD-10-CM | POA: Diagnosis not present

## 2020-11-14 DIAGNOSIS — K219 Gastro-esophageal reflux disease without esophagitis: Secondary | ICD-10-CM | POA: Diagnosis not present

## 2020-11-14 DIAGNOSIS — H8102 Meniere's disease, left ear: Secondary | ICD-10-CM

## 2020-11-14 MED ORDER — METHYLPREDNISOLONE 4 MG PO TBPK: ORAL_TABLET | ORAL | 0 refills | Status: DC

## 2020-11-14 MED ORDER — PANTOPRAZOLE SODIUM 20 MG PO TBEC: 20.0000 mg | DELAYED_RELEASE_TABLET | Freq: Every day | ORAL | 1 refills | Status: DC

## 2020-11-14 MED ORDER — AMLODIPINE BESYLATE 5 MG PO TABS: 5.0000 mg | ORAL_TABLET | Freq: Every day | ORAL | 1 refills | Status: DC

## 2020-11-14 MED ORDER — AMOXICILLIN-POT CLAVULANATE 875-125 MG PO TABS: 1.0000 | ORAL_TABLET | Freq: Two times a day (BID) | ORAL | 0 refills | Status: DC

## 2020-11-14 MED ORDER — SCOPOLAMINE 1 MG/3DAYS TD PT72: 1.0000 | MEDICATED_PATCH | TRANSDERMAL | 1 refills | Status: DC

## 2020-11-14 MED ORDER — HYDROCHLOROTHIAZIDE 12.5 MG PO CAPS: 12.5000 mg | ORAL_CAPSULE | Freq: Every day | ORAL | 1 refills | Status: DC

## 2020-11-14 NOTE — Assessment & Plan Note (Signed)
Chronic.  Uncontrolled.  Refills sent today.  Will draw labs at next visit.  Follow up in 1 month.

## 2020-11-14 NOTE — Assessment & Plan Note (Signed)
Chronic.  Uncontrolled.  Complete course of antibiotics and steroids.  Discussed increasing to HCTZ 25mg .  Patient would like to hold off for now.  Continue with Meclizine and Scolpamine PRN.  Patient can tolerate zyrtec.  Recommend daily.  Follow up in 1 month.

## 2020-11-30 MED ORDER — METOPROLOL SUCCINATE ER 25 MG PO TB24: 25.0000 mg | ORAL_TABLET | Freq: Every day | ORAL | 0 refills | Status: DC

## 2020-12-14 ENCOUNTER — Other Ambulatory Visit: Payer: Self-pay

## 2020-12-14 ENCOUNTER — Encounter: Payer: Self-pay | Admitting: Nurse Practitioner

## 2020-12-14 ENCOUNTER — Ambulatory Visit (INDEPENDENT_AMBULATORY_CARE_PROVIDER_SITE_OTHER): Payer: Medicaid Other | Admitting: Nurse Practitioner

## 2020-12-14 VITALS — BP 160/88 | HR 115 | Temp 99.1°F | Wt 190.8 lb

## 2020-12-14 DIAGNOSIS — I10 Essential (primary) hypertension: Secondary | ICD-10-CM

## 2020-12-14 DIAGNOSIS — H8102 Meniere's disease, left ear: Secondary | ICD-10-CM | POA: Diagnosis not present

## 2020-12-14 MED ORDER — HYDROCHLOROTHIAZIDE 25 MG PO TABS: 25.0000 mg | ORAL_TABLET | Freq: Every day | ORAL | 0 refills | Status: DC

## 2020-12-14 MED ORDER — HYDRALAZINE HCL 10 MG PO TABS: 10.0000 mg | ORAL_TABLET | Freq: Three times a day (TID) | ORAL | 0 refills | Status: DC

## 2020-12-14 NOTE — Progress Notes (Signed)
BP (!) 160/88   Pulse (!) 115   Temp 99.1 F (37.3 C) (Oral)   Wt 190 lb 12.8 oz (86.5 kg)   LMP 12/08/2020 (Approximate)   SpO2 97%   BMI 33.80 kg/m    Subjective:    Patient ID: Dawn Taylor, female    DOB: 29-Jul-1971, 50 y.o.   MRN: [id number]  HPI: Dawn Taylor is a 50 y.o. female  Chief Complaint  Patient presents with  . Hypertension    1 month f/up   DIZZINESS Patient is here for follow up on her Menieres.  She is taking the hydrochlorothiazide daily and it is helping but she is still having dizziness daily.  Not currently using the scolpamine patch or meclizine.   Duration: weeks Description of symptoms: room spinning Duration of episode: hours Dizziness frequency: recurrent Provoking factors: laying down and moving. Aggravating factors:  movement Triggered by rolling over in bed: yes Triggered by bending over: yes Aggravated by head movement: yes Aggravated by exertion, coughing, loud noises: no Recent head injury: no Recent or current viral symptoms: no History of vasovagal episodes: no Nausea: yes Vomiting: no Tinnitus: yes Hearing loss: yes Aural fullness: no Headache: no Photophobia/phonophobia: no Unsteady gait: no Postural instability: no Diplopia, dysarthria, dysphagia or weakness: no Related to exertion: no Pallor: no Diaphoresis: no Dyspnea: no Chest pain: no  GERD Patient states her symptoms have resolved since starting the protonix.  Does not currently have any symptoms of GERD.  HYPERTENSION Elevated at visit today.  She started taking the Metoprolol but having terrible headaches for 5-6 days while she was taking it.  Once she stopped the symptoms resolved. Patient checks blood pressures it runs 150/90s.    Relevant past medical, surgical, family and social history reviewed and updated as indicated. Interim medical history since our last visit reviewed. Allergies and medications reviewed and updated.  Review of Systems  Eyes:  Negative for visual disturbance.  Respiratory: Negative for cough, chest tightness and shortness of breath.   Cardiovascular: Negative for chest pain, palpitations and leg swelling.  Gastrointestinal:       Reflux.  Neurological: Positive for dizziness. Negative for headaches.    Per HPI unless specifically indicated above     Objective:    BP (!) 160/88   Pulse (!) 115   Temp 99.1 F (37.3 C) (Oral)   Wt 190 lb 12.8 oz (86.5 kg)   LMP 12/08/2020 (Approximate)   SpO2 97%   BMI 33.80 kg/m   Wt Readings from Last 3 Encounters:  12/14/20 190 lb 12.8 oz (86.5 kg)  11/14/20 180 lb (81.6 kg)  10/13/20 180 lb (81.6 kg)    Physical Exam Vitals and nursing note reviewed.  Constitutional:      General: She is not in acute distress.    Appearance: Normal appearance. She is normal weight. She is not ill-appearing, toxic-appearing or diaphoretic.  HENT:     Head: Normocephalic.     Right Ear: External ear normal. A middle ear effusion is present. Tympanic membrane is not erythematous.     Left Ear: External ear normal. A middle ear effusion is present. Tympanic membrane is not erythematous.     Nose: Nose normal.     Mouth/Throat:     Mouth: Mucous membranes are moist.     Pharynx: Oropharynx is clear.  Eyes:     General:        Right eye: No discharge.  Left eye: No discharge.     Extraocular Movements: Extraocular movements intact.     Conjunctiva/sclera: Conjunctivae normal.     Pupils: Pupils are equal, round, and reactive to light.  Cardiovascular:     Rate and Rhythm: Normal rate and regular rhythm.     Heart sounds: No murmur heard.   Pulmonary:     Effort: Pulmonary effort is normal. No respiratory distress.     Breath sounds: Normal breath sounds. No wheezing or rales.  Musculoskeletal:     Cervical back: Normal range of motion and neck supple.  Skin:    General: Skin is warm and dry.     Capillary Refill: Capillary refill takes less than 2 seconds.   Neurological:     General: No focal deficit present.     Mental Status: She is alert and oriented to person, place, and time. Mental status is at baseline.  Psychiatric:        Mood and Affect: Mood normal.        Behavior: Behavior normal.        Thought Content: Thought content normal.        Judgment: Judgment normal.     Results for orders placed or performed in visit on 12/14/20  Comprehensive metabolic panel  Result Value Ref Range   Glucose 123 (H) 65 - 99 mg/dL   BUN 9 6 - 24 mg/dL   Creatinine, Ser 0.85 0.57 - 1.00 mg/dL   eGFR 83 >59 mL/min/1.73   BUN/Creatinine Ratio 11 9 - 23   Sodium 143 134 - 144 mmol/L   Potassium 3.5 3.5 - 5.2 mmol/L   Chloride 104 96 - 106 mmol/L   CO2 18 (L) 20 - 29 mmol/L   Calcium 9.4 8.7 - 10.2 mg/dL   Total Protein 7.2 6.0 - 8.5 g/dL   Albumin 4.5 3.8 - 4.8 g/dL   Globulin, Total 2.7 1.5 - 4.5 g/dL   Albumin/Globulin Ratio 1.7 1.2 - 2.2   Bilirubin Total 0.3 0.0 - 1.2 mg/dL   Alkaline Phosphatase 57 44 - 121 IU/L   AST 37 0 - 40 IU/L   ALT 35 (H) 0 - 32 IU/L  Lipid panel  Result Value Ref Range   Cholesterol, Total 216 (H) 100 - 199 mg/dL   Triglycerides 369 (H) 0 - 149 mg/dL   HDL 30 (L) >39 mg/dL   VLDL Cholesterol Cal 65 (H) 5 - 40 mg/dL   LDL Chol Calc (NIH) 121 (H) 0 - 99 mg/dL   Chol/HDL Ratio 7.2 (H) 0.0 - 4.4 ratio      Assessment & Plan:   Problem List Items Addressed This Visit      Cardiovascular and Mediastinum   Essential hypertension    Chronic.  Uncontrolled.  Will add hydralazine TID.  Discussed with patient that if she is not able to tolerate the medication that she should contact the office sooner than 6 weeks.  Will need to refer her to Cardiology.   Labs ordered today.  Follow up in 6 weeks.      Relevant Medications   hydrochlorothiazide (HYDRODIURIL) 25 MG tablet   hydrALAZINE (APRESOLINE) 10 MG tablet   Other Relevant Orders   Comprehensive metabolic panel (Completed)   Lipid panel (Completed)      Nervous and Auditory   Meniere disease, left - Primary    Chronic.  Uncontrolled.  Will increase HCTZ to 30m.  Recommend using Zyrtec to help with dizziness.  Follow up in  6 weeks.  Labs ordered today.           Follow up plan: Return in about 6 weeks (around 01/25/2021) for BP Check.

## 2020-12-15 DIAGNOSIS — E7800 Pure hypercholesterolemia, unspecified: Secondary | ICD-10-CM | POA: Insufficient documentation

## 2020-12-15 LAB — COMPREHENSIVE METABOLIC PANEL
ALT: 35 IU/L — ABNORMAL HIGH (ref 0–32)
AST: 37 IU/L (ref 0–40)
Albumin/Globulin Ratio: 1.7 (ref 1.2–2.2)
Albumin: 4.5 g/dL (ref 3.8–4.8)
Alkaline Phosphatase: 57 IU/L (ref 44–121)
BUN/Creatinine Ratio: 11 (ref 9–23)
BUN: 9 mg/dL (ref 6–24)
Bilirubin Total: 0.3 mg/dL (ref 0.0–1.2)
CO2: 18 mmol/L — ABNORMAL LOW (ref 20–29)
Calcium: 9.4 mg/dL (ref 8.7–10.2)
Chloride: 104 mmol/L (ref 96–106)
Creatinine, Ser: 0.85 mg/dL (ref 0.57–1.00)
Globulin, Total: 2.7 g/dL (ref 1.5–4.5)
Glucose: 123 mg/dL — ABNORMAL HIGH (ref 65–99)
Potassium: 3.5 mmol/L (ref 3.5–5.2)
Sodium: 143 mmol/L (ref 134–144)
Total Protein: 7.2 g/dL (ref 6.0–8.5)
eGFR: 83 mL/min/{1.73_m2} (ref >59)

## 2020-12-15 LAB — LIPID PANEL
Chol/HDL Ratio: 7.2 ratio — ABNORMAL HIGH (ref 0.0–4.4)
Cholesterol, Total: 216 mg/dL — ABNORMAL HIGH (ref 100–199)
HDL: 30 mg/dL — ABNORMAL LOW (ref >39)
LDL Chol Calc (NIH): 121 mg/dL — ABNORMAL HIGH (ref 0–99)
Triglycerides: 369 mg/dL — ABNORMAL HIGH (ref 0–149)
VLDL Cholesterol Cal: 65 mg/dL — ABNORMAL HIGH (ref 5–40)

## 2020-12-15 MED ORDER — ROSUVASTATIN CALCIUM 5 MG PO TABS: 5.0000 mg | ORAL_TABLET | Freq: Every day | ORAL | 1 refills | Status: DC

## 2020-12-15 NOTE — Assessment & Plan Note (Signed)
Chronic.  Uncontrolled.  Will increase HCTZ to 25mg .  Recommend using Zyrtec to help with dizziness.  Follow up in 6 weeks.  Labs ordered today.

## 2020-12-15 NOTE — Progress Notes (Signed)
Hi Dawn Taylor.  It was good to see you yesterday.  Your lab work shows that your glucose is elevated but that is likely because you weren't fasting.  Your cholesterol is elevated.  I calculated your cardiac risk score and it puts you in the moderate risk category which means that you are at risk for a cardiac event (heart attack or stroke) over the next 10 years.  I recommend you start a cholesterol medication called a statin.  I know you are very sensitive to medications so we can start at the lowest dose and go from there. I recommend crestor 5mg .  Side effects are muscle aches and fatigue.  If you agree, I can send this to the pharmacy for you.  I also recommend you decrease the fat in your diet.  Prioritize lean proteins, fruits and vegetables.  Let me know what you think.  We will recheck this in 6 months. Please let me know if you have any questions.

## 2020-12-15 NOTE — Assessment & Plan Note (Addendum)
Chronic.  Uncontrolled.  Will add hydralazine TID.  Discussed with patient that if she is not able to tolerate the medication that she should contact the office sooner than 6 weeks.  Will need to refer her to Cardiology.   Labs ordered today.  Follow up in 6 weeks.

## 2020-12-21 ENCOUNTER — Other Ambulatory Visit: Payer: Self-pay

## 2020-12-21 ENCOUNTER — Encounter: Payer: Self-pay | Admitting: Family Medicine

## 2020-12-21 ENCOUNTER — Ambulatory Visit (INDEPENDENT_AMBULATORY_CARE_PROVIDER_SITE_OTHER): Payer: Medicaid Other | Admitting: Family Medicine

## 2020-12-21 VITALS — BP 144/82 | HR 105 | Wt 190.4 lb

## 2020-12-21 DIAGNOSIS — R739 Hyperglycemia, unspecified: Secondary | ICD-10-CM | POA: Diagnosis not present

## 2020-12-21 DIAGNOSIS — R002 Palpitations: Secondary | ICD-10-CM

## 2020-12-21 DIAGNOSIS — R8281 Pyuria: Secondary | ICD-10-CM | POA: Diagnosis not present

## 2020-12-21 DIAGNOSIS — R410 Disorientation, unspecified: Secondary | ICD-10-CM

## 2020-12-21 LAB — URINALYSIS, ROUTINE W REFLEX MICROSCOPIC
Bilirubin, UA: NEGATIVE
Glucose, UA: NEGATIVE
Ketones, UA: NEGATIVE
Nitrite, UA: NEGATIVE
Protein,UA: NEGATIVE
Specific Gravity, UA: 1.015 (ref 1.005–1.030)
Urobilinogen, Ur: 0.2 mg/dL (ref 0.2–1.0)
pH, UA: 6 (ref 5.0–7.5)

## 2020-12-21 LAB — MICROSCOPIC EXAMINATION

## 2020-12-21 LAB — BAYER DCA HB A1C WAIVED: HB A1C (BAYER DCA - WAIVED): 5.2 % (ref <7.0)

## 2020-12-21 NOTE — Assessment & Plan Note (Addendum)
Will check labs today. EKG only showed tachycardia. Will check CT head to r/o stroke. Normal neurologic exam today. Continue to monitor closely. Recheck 1 week. Call with any concerns.   Addendum 12/22/20- Insurance would not cover CT. Will cover MRI. Order changed. Await results.

## 2020-12-21 NOTE — Progress Notes (Signed)
NSR at 104bpm, No ST segment changes

## 2020-12-21 NOTE — Progress Notes (Addendum)
BP (!) 144/82   Pulse (!) 105   Wt 190 lb 6.4 oz (86.4 kg)   LMP 12/08/2020 (Approximate)   SpO2 98%   BMI 33.73 kg/m    Subjective:    Patient ID: Dawn Taylor, female    DOB: March 11, 1971, 50 y.o.   MRN: [id number]  HPI: Dawn Taylor is a 50 y.o. female  Chief Complaint  Patient presents with  . Numbness    Patient states she had some type of episode on Monday where he vision got blurry, face felt numb, and mouth was completely dry, wasn't really feeling herself. Patient states she still doesn't feel herself. Patient boyfriend called EMS and was told it was an anxiety attack.    Autumn presents today not feeling well. She notes that she was out of town in [place] over the weekend. She notes that she was fine on Sunday. She started feeling weird on Monday night with spots in her vision. She went to lay down and felt like she had someone sitting on the top of her head. She then started feeling even weirder. She felt incredibly disoriented like she wasn't in her body. She felt like her whole body was numb, but especially her face. She notes that she doesn't remember anything clearly. She did not pass out, but was very confused and didn't feel like she was herself.  She had a very dry mouth. Went to the hospital in [place] and was evaluated but they did not do any blood work, did not do a CT. She went back to her hotel and felt better the next day- but still has not felt normal. She came in today to make sure that she didn't have a stroke.   Relevant past medical, surgical, family and social history reviewed and updated as indicated. Interim medical history since our last visit reviewed. Allergies and medications reviewed and updated.  Review of Systems  Constitutional: Positive for fatigue. Negative for activity change, appetite change, chills, diaphoresis, fever and unexpected weight change.  Respiratory: Negative.   Cardiovascular: Positive for palpitations. Negative for chest  pain and leg swelling.  Gastrointestinal: Negative.   Genitourinary: Negative.   Musculoskeletal: Negative.   Neurological: Positive for dizziness, speech difficulty, light-headedness, numbness and headaches. Negative for tremors, seizures, syncope, facial asymmetry and weakness.  Psychiatric/Behavioral: Positive for confusion and sleep disturbance. Negative for agitation, behavioral problems, decreased concentration, dysphoric mood, hallucinations, self-injury and suicidal ideas. The patient is not nervous/anxious and is not hyperactive.     Per HPI unless specifically indicated above     Objective:    BP (!) 144/82   Pulse (!) 105   Wt 190 lb 6.4 oz (86.4 kg)   LMP 12/08/2020 (Approximate)   SpO2 98%   BMI 33.73 kg/m   Wt Readings from Last 3 Encounters:  12/21/20 190 lb 6.4 oz (86.4 kg)  12/14/20 190 lb 12.8 oz (86.5 kg)  11/14/20 180 lb (81.6 kg)    Physical Exam Vitals and nursing note reviewed.  Constitutional:      General: She is not in acute distress.    Appearance: Normal appearance. She is obese. She is not ill-appearing, toxic-appearing or diaphoretic.  HENT:     Head: Normocephalic and atraumatic.     Right Ear: External ear normal.     Left Ear: External ear normal.     Nose: Nose normal.     Mouth/Throat:     Mouth: Mucous membranes are moist.  Pharynx: Oropharynx is clear.  Eyes:     General: No scleral icterus.       Right eye: No discharge.        Left eye: No discharge.     Extraocular Movements: Extraocular movements intact.     Conjunctiva/sclera: Conjunctivae normal.     Pupils: Pupils are equal, round, and reactive to light.  Cardiovascular:     Rate and Rhythm: Normal rate and regular rhythm.     Pulses: Normal pulses.     Heart sounds: Normal heart sounds. No murmur heard. No friction rub. No gallop.   Pulmonary:     Effort: Pulmonary effort is normal. No respiratory distress.     Breath sounds: Normal breath sounds. No stridor. No  wheezing, rhonchi or rales.  Chest:     Chest wall: No tenderness.  Musculoskeletal:        General: Normal range of motion.     Cervical back: Normal range of motion and neck supple.  Skin:    General: Skin is warm and dry.     Capillary Refill: Capillary refill takes less than 2 seconds.     Coloration: Skin is not jaundiced or pale.     Findings: No bruising, erythema, lesion or rash.  Neurological:     General: No focal deficit present.     Mental Status: She is alert and oriented to person, place, and time. Mental status is at baseline.  Psychiatric:        Mood and Affect: Mood normal.        Behavior: Behavior normal.        Thought Content: Thought content normal.        Judgment: Judgment normal.     Results for orders placed or performed in visit on 12/21/20  Microscopic Examination   BLD  Result Value Ref Range   WBC, UA 0-5 0 - 5 /hpf   RBC 0-2 0 - 2 /hpf   Epithelial Cells (non renal) 0-10 0 - 10 /hpf   Mucus, UA Present (A) Not Estab.   Bacteria, UA Few (A) None seen/Few  Bayer DCA Hb A1c Waived  Result Value Ref Range   HB A1C (BAYER DCA - WAIVED) 5.2 <7.0 %  CBC with Differential/Platelet  Result Value Ref Range   WBC 7.3 3.4 - 10.8 x10E3/uL   RBC 4.41 3.77 - 5.28 x10E6/uL   Hemoglobin 13.9 11.1 - 15.9 g/dL   Hematocrit 40.3 34.0 - 46.6 %   MCV 91 79 - 97 fL   MCH 31.5 26.6 - 33.0 pg   MCHC 34.5 31.5 - 35.7 g/dL   RDW 12.9 11.7 - 15.4 %   Platelets 307 150 - 450 x10E3/uL   Neutrophils 61 Not Estab. %   Lymphs 29 Not Estab. %   Monocytes 7 Not Estab. %   Eos 2 Not Estab. %   Basos 1 Not Estab. %   Neutrophils Absolute 4.5 1.4 - 7.0 x10E3/uL   Lymphocytes Absolute 2.1 0.7 - 3.1 x10E3/uL   Monocytes Absolute 0.5 0.1 - 0.9 x10E3/uL   EOS (ABSOLUTE) 0.2 0.0 - 0.4 x10E3/uL   Basophils Absolute 0.1 0.0 - 0.2 x10E3/uL   Immature Granulocytes 0 Not Estab. %   Immature Grans (Abs) 0.0 0.0 - 0.1 H08M5/HQ  Basic metabolic panel  Result Value Ref Range    Glucose 108 (H) 65 - 99 mg/dL   BUN 9 6 - 24 mg/dL   Creatinine, Ser 0.75 0.57 - 1.00  mg/dL   eGFR 97 >59 mL/min/1.73   BUN/Creatinine Ratio 12 9 - 23   Sodium 139 134 - 144 mmol/L   Potassium 3.4 (L) 3.5 - 5.2 mmol/L   Chloride 98 96 - 106 mmol/L   CO2 23 20 - 29 mmol/L   Calcium 9.8 8.7 - 10.2 mg/dL  TSH  Result Value Ref Range   TSH 2.110 0.450 - 4.500 uIU/mL  VITAMIN D 25 Hydroxy (Vit-D Deficiency, Fractures)  Result Value Ref Range   Vit D, 25-Hydroxy 10.9 (L) 30.0 - 100.0 ng/mL  Urinalysis, Routine w reflex microscopic  Result Value Ref Range   Specific Gravity, UA 1.015 1.005 - 1.030   pH, UA 6.0 5.0 - 7.5   Color, UA Yellow Yellow   Appearance Ur Cloudy (A) Clear   Leukocytes,UA 1+ (A) Negative   Protein,UA Negative Negative/Trace   Glucose, UA Negative Negative   Ketones, UA Negative Negative   RBC, UA 1+ (A) Negative   Bilirubin, UA Negative Negative   Urobilinogen, Ur 0.2 0.2 - 1.0 mg/dL   Nitrite, UA Negative Negative   Microscopic Examination See below:       Assessment & Plan:   Problem List Items Addressed This Visit      Other   Disorientation - Primary    Will check labs today. EKG only showed tachycardia. Will check CT head to r/o stroke. Normal neurologic exam today. Continue to monitor closely. Recheck 1 week. Call with any concerns.   Addendum 12/22/20- Insurance would not cover CT. Will cover MRI. Order changed. Await results.       Relevant Orders   Bayer DCA Hb A1c Waived (Completed)   CBC with Differential/Platelet (Completed)   Basic metabolic panel (Completed)   TSH (Completed)   VITAMIN D 25 Hydroxy (Vit-D Deficiency, Fractures) (Completed)   Urinalysis, Routine w reflex microscopic (Completed)   MR Brain Wo Contrast    Other Visit Diagnoses    Palpitations       Sinus tach. Otherwise normal. Continue to monitor. Call with any concerns.    Relevant Orders   EKG 12-Lead (Completed)   Pyuria       Will check culture.    Relevant  Orders   Urine Culture       Follow up plan: Return in about 1 week (around 12/28/2020) for PCP.  >30 minutes spent with patient today

## 2020-12-22 ENCOUNTER — Telehealth: Payer: Self-pay

## 2020-12-22 ENCOUNTER — Ambulatory Visit
Admission: RE | Admit: 2020-12-22 | Discharge: 2020-12-22 | Disposition: A | Discharge status: Home / Self Care | Payer: Medicaid Other | Source: Ambulatory Visit | Attending: Family Medicine | Admitting: Family Medicine

## 2020-12-22 ENCOUNTER — Other Ambulatory Visit: Payer: Self-pay | Admitting: Family Medicine

## 2020-12-22 DIAGNOSIS — R410 Disorientation, unspecified: Secondary | ICD-10-CM | POA: Diagnosis not present

## 2020-12-22 DIAGNOSIS — E559 Vitamin D deficiency, unspecified: Secondary | ICD-10-CM | POA: Insufficient documentation

## 2020-12-22 DIAGNOSIS — R4182 Altered mental status, unspecified: Secondary | ICD-10-CM | POA: Diagnosis not present

## 2020-12-22 LAB — CBC WITH DIFFERENTIAL/PLATELET
Basophils Absolute: 0.1 10*3/uL (ref 0.0–0.2)
Basos: 1 %
EOS (ABSOLUTE): 0.2 10*3/uL (ref 0.0–0.4)
Eos: 2 %
Hematocrit: 40.3 % (ref 34.0–46.6)
Hemoglobin: 13.9 g/dL (ref 11.1–15.9)
Immature Grans (Abs): 0 10*3/uL (ref 0.0–0.1)
Immature Granulocytes: 0 %
Lymphocytes Absolute: 2.1 10*3/uL (ref 0.7–3.1)
Lymphs: 29 %
MCH: 31.5 pg (ref 26.6–33.0)
MCHC: 34.5 g/dL (ref 31.5–35.7)
MCV: 91 fL (ref 79–97)
Monocytes Absolute: 0.5 10*3/uL (ref 0.1–0.9)
Monocytes: 7 %
Neutrophils Absolute: 4.5 10*3/uL (ref 1.4–7.0)
Neutrophils: 61 %
Platelets: 307 10*3/uL (ref 150–450)
RBC: 4.41 x10E6/uL (ref 3.77–5.28)
RDW: 12.9 % (ref 11.7–15.4)
WBC: 7.3 10*3/uL (ref 3.4–10.8)

## 2020-12-22 LAB — BASIC METABOLIC PANEL
BUN/Creatinine Ratio: 12 (ref 9–23)
BUN: 9 mg/dL (ref 6–24)
CO2: 23 mmol/L (ref 20–29)
Calcium: 9.8 mg/dL (ref 8.7–10.2)
Chloride: 98 mmol/L (ref 96–106)
Creatinine, Ser: 0.75 mg/dL (ref 0.57–1.00)
Glucose: 108 mg/dL — ABNORMAL HIGH (ref 65–99)
Potassium: 3.4 mmol/L — ABNORMAL LOW (ref 3.5–5.2)
Sodium: 139 mmol/L (ref 134–144)
eGFR: 97 mL/min/{1.73_m2} (ref >59)

## 2020-12-22 LAB — VITAMIN D 25 HYDROXY (VIT D DEFICIENCY, FRACTURES): Vit D, 25-Hydroxy: 10.9 ng/mL — ABNORMAL LOW (ref 30.0–100.0)

## 2020-12-22 LAB — TSH: TSH: 2.11 u[IU]/mL (ref 0.450–4.500)

## 2020-12-22 MED ORDER — VITAMIN D (ERGOCALCIFEROL) 1.25 MG (50000 UNIT) PO CAPS: 50000.0000 [IU] | ORAL_CAPSULE | ORAL | 1 refills | Status: DC

## 2020-12-22 MED ORDER — ALPRAZOLAM 0.5 MG PO TABS: ORAL_TABLET | ORAL | 0 refills | Status: DC

## 2020-12-22 NOTE — Telephone Encounter (Signed)
Copied from [place] [id number]. Topic: Quick Communication - Lab Results (Clinic Use ONLY) >> Dec 22, 2020 10:02 AM Lennox Solders wrote: Please would like blood work results

## 2020-12-22 NOTE — Telephone Encounter (Signed)
Dr. Wynetta Emery saw the patient yesterday.  Please let the patient know that she will result on them when she has a free moment as she has been seeing patient's all day.

## 2020-12-22 NOTE — Telephone Encounter (Signed)
Lab results given to patient.

## 2020-12-22 NOTE — Telephone Encounter (Signed)
Routing to provider  

## 2020-12-22 NOTE — Addendum Note (Signed)
Addended by: Valerie Roys on: 12/22/2020 08:54 AM   Modules accepted: Orders

## 2020-12-23 LAB — URINE CULTURE

## 2020-12-28 ENCOUNTER — Other Ambulatory Visit: Payer: Self-pay

## 2020-12-28 ENCOUNTER — Encounter: Payer: Self-pay | Admitting: Nurse Practitioner

## 2020-12-28 ENCOUNTER — Ambulatory Visit (INDEPENDENT_AMBULATORY_CARE_PROVIDER_SITE_OTHER): Payer: Medicaid Other | Admitting: Nurse Practitioner

## 2020-12-28 VITALS — BP 140/88 | HR 72 | Temp 99.0°F | Wt 192.0 lb

## 2020-12-28 DIAGNOSIS — F419 Anxiety disorder, unspecified: Secondary | ICD-10-CM | POA: Diagnosis not present

## 2020-12-28 MED ORDER — PAROXETINE HCL 10 MG PO TABS: 10.0000 mg | ORAL_TABLET | Freq: Every day | ORAL | 0 refills | Status: DC

## 2020-12-28 MED ORDER — LORAZEPAM 0.5 MG PO TABS: 0.5000 mg | ORAL_TABLET | Freq: Two times a day (BID) | ORAL | 1 refills | Status: DC | PRN

## 2020-12-28 NOTE — Progress Notes (Signed)
BP 140/88   Pulse 72   Temp 99 F (37.2 C) (Oral)   Wt 192 lb (87.1 kg)   LMP 12/08/2020 (Approximate)   SpO2 96%   BMI 34.01 kg/m    Subjective:    Patient ID: Dawn Taylor, female    DOB: 08-28-1970, 50 y.o.   MRN: [id number]  HPI: Dawn Taylor is a 50 y.o. female  Chief Complaint  Patient presents with  . Review MRI  . Vitamin D deficiency  . Hypertension   12/28/20 Patient states she has been having panic attacks.  She is having about 3 per day.  Patient states she hasn't been on anxiety medication in several years.  She has tried Paxil in the past but it has been awhile.  Denies other concerns at visit today. Denies SI.  Denies HA, CP, SOB, dizziness, palpitations, visual changes, and lower extremity swelling.  12/21/20 Dawn Taylor presents today not feeling well. She notes that she was out of town in [place] over the weekend. She notes that she was fine on Sunday. She started feeling weird on Monday night with spots in her vision. She went to lay down and felt like she had someone sitting on the top of her head. She then started feeling even weirder. She felt incredibly disoriented like she wasn't in her body. She felt like her whole body was numb, but especially her face. She notes that she doesn't remember anything clearly. She did not pass out, but was very confused and didn't feel like she was herself.  She had a very dry mouth. Went to the hospital in [place] and was evaluated but they did not do any blood work, did not do a CT. She went back to her hotel and felt better the next day- but still has not felt normal. She came in today to make sure that she didn't have a stroke.   Relevant past medical, surgical, family and social history reviewed and updated as indicated. Interim medical history since our last visit reviewed. Allergies and medications reviewed and updated.  Review of Systems  Eyes: Negative for visual disturbance.  Respiratory: Negative for cough, chest  tightness and shortness of breath.   Cardiovascular: Negative for chest pain, palpitations and leg swelling.  Neurological: Positive for dizziness. Negative for headaches.  Psychiatric/Behavioral: Negative for suicidal ideas. The patient is nervous/anxious.     Per HPI unless specifically indicated above     Objective:    BP 140/88   Pulse 72   Temp 99 F (37.2 C) (Oral)   Wt 192 lb (87.1 kg)   LMP 12/08/2020 (Approximate)   SpO2 96%   BMI 34.01 kg/m   Wt Readings from Last 3 Encounters:  12/28/20 192 lb (87.1 kg)  12/21/20 190 lb 6.4 oz (86.4 kg)  12/14/20 190 lb 12.8 oz (86.5 kg)    Physical Exam Vitals and nursing note reviewed.  Constitutional:      General: She is not in acute distress.    Appearance: Normal appearance. She is obese. She is not ill-appearing, toxic-appearing or diaphoretic.  HENT:     Head: Normocephalic and atraumatic.     Right Ear: External ear normal.     Left Ear: External ear normal.     Nose: Nose normal.     Mouth/Throat:     Mouth: Mucous membranes are moist.     Pharynx: Oropharynx is clear.  Eyes:     General: No scleral icterus.  Right eye: No discharge.        Left eye: No discharge.     Extraocular Movements: Extraocular movements intact.     Conjunctiva/sclera: Conjunctivae normal.     Pupils: Pupils are equal, round, and reactive to light.  Cardiovascular:     Rate and Rhythm: Normal rate and regular rhythm.     Pulses: Normal pulses.     Heart sounds: Normal heart sounds. No murmur heard. No friction rub. No gallop.   Pulmonary:     Effort: Pulmonary effort is normal. No respiratory distress.     Breath sounds: Normal breath sounds. No stridor. No wheezing, rhonchi or rales.  Chest:     Chest wall: No tenderness.  Musculoskeletal:        General: Normal range of motion.     Cervical back: Normal range of motion and neck supple.  Skin:    General: Skin is warm and dry.     Capillary Refill: Capillary refill takes  less than 2 seconds.     Coloration: Skin is not jaundiced or pale.     Findings: No bruising, erythema, lesion or rash.  Neurological:     General: No focal deficit present.     Mental Status: She is alert and oriented to person, place, and time. Mental status is at baseline.  Psychiatric:        Mood and Affect: Mood normal.        Behavior: Behavior normal.        Thought Content: Thought content normal.        Judgment: Judgment normal.     Results for orders placed or performed in visit on 12/21/20  Urine Culture   Specimen: Urine   UR  Result Value Ref Range   Urine Culture, Routine Final report    Organism ID, Bacteria Comment   Microscopic Examination   BLD  Result Value Ref Range   WBC, UA 0-5 0 - 5 /hpf   RBC 0-2 0 - 2 /hpf   Epithelial Cells (non renal) 0-10 0 - 10 /hpf   Mucus, UA Present (A) Not Estab.   Bacteria, UA Few (A) None seen/Few  Bayer DCA Hb A1c Waived  Result Value Ref Range   HB A1C (BAYER DCA - WAIVED) 5.2 <7.0 %  CBC with Differential/Platelet  Result Value Ref Range   WBC 7.3 3.4 - 10.8 x10E3/uL   RBC 4.41 3.77 - 5.28 x10E6/uL   Hemoglobin 13.9 11.1 - 15.9 g/dL   Hematocrit 40.3 34.0 - 46.6 %   MCV 91 79 - 97 fL   MCH 31.5 26.6 - 33.0 pg   MCHC 34.5 31.5 - 35.7 g/dL   RDW 12.9 11.7 - 15.4 %   Platelets 307 150 - 450 x10E3/uL   Neutrophils 61 Not Estab. %   Lymphs 29 Not Estab. %   Monocytes 7 Not Estab. %   Eos 2 Not Estab. %   Basos 1 Not Estab. %   Neutrophils Absolute 4.5 1.4 - 7.0 x10E3/uL   Lymphocytes Absolute 2.1 0.7 - 3.1 x10E3/uL   Monocytes Absolute 0.5 0.1 - 0.9 x10E3/uL   EOS (ABSOLUTE) 0.2 0.0 - 0.4 x10E3/uL   Basophils Absolute 0.1 0.0 - 0.2 x10E3/uL   Immature Granulocytes 0 Not Estab. %   Immature Grans (Abs) 0.0 0.0 - 0.1 X11B5/MC  Basic metabolic panel  Result Value Ref Range   Glucose 108 (H) 65 - 99 mg/dL   BUN 9 6 - 24  mg/dL   Creatinine, Ser 0.75 0.57 - 1.00 mg/dL   eGFR 97 >59 mL/min/1.73   BUN/Creatinine  Ratio 12 9 - 23   Sodium 139 134 - 144 mmol/L   Potassium 3.4 (L) 3.5 - 5.2 mmol/L   Chloride 98 96 - 106 mmol/L   CO2 23 20 - 29 mmol/L   Calcium 9.8 8.7 - 10.2 mg/dL  TSH  Result Value Ref Range   TSH 2.110 0.450 - 4.500 uIU/mL  VITAMIN D 25 Hydroxy (Vit-D Deficiency, Fractures)  Result Value Ref Range   Vit D, 25-Hydroxy 10.9 (L) 30.0 - 100.0 ng/mL  Urinalysis, Routine w reflex microscopic  Result Value Ref Range   Specific Gravity, UA 1.015 1.005 - 1.030   pH, UA 6.0 5.0 - 7.5   Color, UA Yellow Yellow   Appearance Ur Cloudy (A) Clear   Leukocytes,UA 1+ (A) Negative   Protein,UA Negative Negative/Trace   Glucose, UA Negative Negative   Ketones, UA Negative Negative   RBC, UA 1+ (A) Negative   Bilirubin, UA Negative Negative   Urobilinogen, Ur 0.2 0.2 - 1.0 mg/dL   Nitrite, UA Negative Negative   Microscopic Examination See below:       Assessment & Plan:   Problem List Items Addressed This Visit      Other   Anxiety - Primary   Relevant Medications   PARoxetine (PAXIL) 10 MG tablet   LORazepam (ATIVAN) 0.5 MG tablet       Follow up plan: Return in about 1 month (around 01/28/2021) for meniere's follow up.  >30 minutes spent with patient today

## 2021-01-02 ENCOUNTER — Other Ambulatory Visit: Payer: Self-pay | Admitting: Nurse Practitioner

## 2021-01-11 ENCOUNTER — Ambulatory Visit: Payer: Self-pay | Admitting: *Deleted

## 2021-01-11 ENCOUNTER — Other Ambulatory Visit: Payer: Self-pay | Admitting: Nurse Practitioner

## 2021-01-11 NOTE — Telephone Encounter (Signed)
Communicating with patient via mychart.

## 2021-01-11 NOTE — Telephone Encounter (Signed)
Reason for Disposition  MODERATE-SEVERE eyelid swelling on one side  (Exception: due to a mosquito bite)  Answer Assessment - Initial Assessment Questions 1. ONSET: "When did the swelling start?" (e.g., minutes, hours, days)     1 week ago 2. LOCATION: "What part of the eyelids is swollen?"    Bump right under brow, where lid meets 3. SEVERITY: "How swollen is it?"     Pea size 4. ITCHING: "Is there any itching?" If Yes, ask: "How much?"   (Scale 1-10; mild, moderate or severe)     no 5. PAIN: "Is the swelling painful to touch?" If Yes, ask: "How painful is it?"   (Scale 1-10; mild, moderate or severe)     Headache 6. FEVER: "Do you have a fever?" If Yes, ask: "What is it, how was it measured, and when did it start?"      no 7. CAUSE: "What do you think is causing the swelling?"     Unsure 8. RECURRENT SYMPTOM: "Have you had eyelid swelling before?" If Yes, ask: "When was the last time?" "What happened that time?"    unsure 9. OTHER SYMPTOMS: "Do you have any other symptoms?" (e.g., blurred vision, eye discharge, rash, runny nose)    Hard to touch  Protocols used: Eye - Swelling-A-AH

## 2021-01-11 NOTE — Telephone Encounter (Signed)
Pt reports "Knot" on right eye lid, "Right under brow where lid meets." Reports noted 1 week ago, has grown larger, now size of pea to marble. States hard, no drainage, pinkish in color. HAs been applying warm compresses. States affecting vision as on lid. Denies any pain. Does report headache at times. No availability within protocol timeframe of 24 hours. Pt questioning if she could do virtual anytime after 4. Pt is presently out of town. Assured pt NT would route to practice for PCPs review and final disposition. Advised may need UC visit. Pt verbalizes understanding. Please advise: [phone] States may leave detailed message if she cannot answer phone.

## 2021-01-11 NOTE — Telephone Encounter (Signed)
Please advise 

## 2021-01-31 ENCOUNTER — Ambulatory Visit (INDEPENDENT_AMBULATORY_CARE_PROVIDER_SITE_OTHER): Payer: Medicaid Other | Admitting: Nurse Practitioner

## 2021-01-31 ENCOUNTER — Other Ambulatory Visit: Payer: Self-pay

## 2021-01-31 ENCOUNTER — Encounter: Payer: Self-pay | Admitting: Nurse Practitioner

## 2021-01-31 VITALS — BP 145/84 | HR 84 | Temp 97.9°F | Wt 192.2 lb

## 2021-01-31 DIAGNOSIS — I10 Essential (primary) hypertension: Secondary | ICD-10-CM

## 2021-01-31 DIAGNOSIS — H8102 Meniere's disease, left ear: Secondary | ICD-10-CM | POA: Diagnosis not present

## 2021-01-31 DIAGNOSIS — F419 Anxiety disorder, unspecified: Secondary | ICD-10-CM

## 2021-01-31 MED ORDER — HYDRALAZINE HCL 10 MG PO TABS: 10.0000 mg | ORAL_TABLET | Freq: Two times a day (BID) | ORAL | 0 refills | Status: DC

## 2021-01-31 NOTE — Assessment & Plan Note (Signed)
Chronic. Improved with current medication regimen. Continue with Paxil and lorazepam PRN. Return to clinic in 2 months for reevaluation. Call sooner if concerns arise.

## 2021-01-31 NOTE — Assessment & Plan Note (Signed)
Chronic.  Patient has been out of medication but when she was taking hydralazine TID she was having low blood pressures.  Will change Hydralazine to BID.  Continue to monitor blood pressures at home.  Bring log to next visit. Follow up in 2 months.

## 2021-01-31 NOTE — Assessment & Plan Note (Signed)
Chronic. Improved due to anxiety being under control and drinking less soda. Continue with current medication regimen. Follow up in 2 months.

## 2021-01-31 NOTE — Progress Notes (Signed)
BP (!) 145/84   Pulse 84   Temp 97.9 F (36.6 C)   Wt 192 lb 3.2 oz (87.2 kg)   SpO2 97%   BMI 34.05 kg/m    Subjective:    Patient ID: Dawn Taylor, female    DOB: May 07, 1971, 50 y.o.   MRN: [id number]  HPI: Dawn Taylor is a 50 y.o. female  Chief Complaint  Patient presents with   Hypertension   Meniere's Disease    Follow up    Patient states she is feeling a lot better. Patient states she feels like the Paxil is working well for her. She has switched from drinking Dr. Malachi Bonds and is now drinking water only. States she feels like this is helping her anxiety and menieres.  As her anxiety is getting better she has decreased her use of lorazepam to every other day.   Patient states she is checking blood pressures at home and they are running 140/90s right now because she has been out of medication. When she was taking Hydralazine TID it was dropping her systolic into the 46T and she wasn't feeling well.    Denies HA, CP, SOB, dizziness, palpitations, visual changes, and lower extremity swelling.    Relevant past medical, surgical, family and social history reviewed and updated as indicated. Interim medical history since our last visit reviewed. Allergies and medications reviewed and updated.  Review of Systems  Eyes:  Negative for visual disturbance.  Respiratory:  Negative for cough, chest tightness and shortness of breath.   Cardiovascular:  Negative for chest pain, palpitations and leg swelling.  Neurological:  Positive for dizziness. Negative for headaches.  Psychiatric/Behavioral:  Negative for suicidal ideas. The patient is nervous/anxious.    Per HPI unless specifically indicated above     Objective:    BP (!) 145/84   Pulse 84   Temp 97.9 F (36.6 C)   Wt 192 lb 3.2 oz (87.2 kg)   SpO2 97%   BMI 34.05 kg/m   Wt Readings from Last 3 Encounters:  01/31/21 192 lb 3.2 oz (87.2 kg)  12/28/20 192 lb (87.1 kg)  12/21/20 190 lb 6.4 oz (86.4 kg)     Physical Exam Vitals and nursing note reviewed.  Constitutional:      General: She is not in acute distress.    Appearance: Normal appearance. She is obese. She is not ill-appearing, toxic-appearing or diaphoretic.  HENT:     Head: Normocephalic and atraumatic.     Right Ear: External ear normal.     Left Ear: External ear normal.     Nose: Nose normal.     Mouth/Throat:     Mouth: Mucous membranes are moist.     Pharynx: Oropharynx is clear.  Eyes:     General: No scleral icterus.       Right eye: No discharge.        Left eye: No discharge.     Extraocular Movements: Extraocular movements intact.     Conjunctiva/sclera: Conjunctivae normal.     Pupils: Pupils are equal, round, and reactive to light.  Cardiovascular:     Rate and Rhythm: Normal rate and regular rhythm.     Pulses: Normal pulses.     Heart sounds: Normal heart sounds. No murmur heard.   No friction rub. No gallop.  Pulmonary:     Effort: Pulmonary effort is normal. No respiratory distress.     Breath sounds: Normal breath sounds. No stridor. No wheezing, rhonchi  or rales.  Chest:     Chest wall: No tenderness.  Musculoskeletal:        General: Normal range of motion.     Cervical back: Normal range of motion and neck supple.  Skin:    General: Skin is warm and dry.     Capillary Refill: Capillary refill takes less than 2 seconds.     Coloration: Skin is not jaundiced or pale.     Findings: No bruising, erythema, lesion or rash.  Neurological:     General: No focal deficit present.     Mental Status: She is alert and oriented to person, place, and time. Mental status is at baseline.  Psychiatric:        Mood and Affect: Mood normal.        Behavior: Behavior normal.        Thought Content: Thought content normal.        Judgment: Judgment normal.    Results for orders placed or performed in visit on 12/21/20  Urine Culture   Specimen: Urine   UR  Result Value Ref Range   Urine Culture, Routine  Final report    Organism ID, Bacteria Comment   Microscopic Examination   BLD  Result Value Ref Range   WBC, UA 0-5 0 - 5 /hpf   RBC 0-2 0 - 2 /hpf   Epithelial Cells (non renal) 0-10 0 - 10 /hpf   Mucus, UA Present (A) Not Estab.   Bacteria, UA Few (A) None seen/Few  Bayer DCA Hb A1c Waived  Result Value Ref Range   HB A1C (BAYER DCA - WAIVED) 5.2 <7.0 %  CBC with Differential/Platelet  Result Value Ref Range   WBC 7.3 3.4 - 10.8 x10E3/uL   RBC 4.41 3.77 - 5.28 x10E6/uL   Hemoglobin 13.9 11.1 - 15.9 g/dL   Hematocrit 40.3 34.0 - 46.6 %   MCV 91 79 - 97 fL   MCH 31.5 26.6 - 33.0 pg   MCHC 34.5 31.5 - 35.7 g/dL   RDW 12.9 11.7 - 15.4 %   Platelets 307 150 - 450 x10E3/uL   Neutrophils 61 Not Estab. %   Lymphs 29 Not Estab. %   Monocytes 7 Not Estab. %   Eos 2 Not Estab. %   Basos 1 Not Estab. %   Neutrophils Absolute 4.5 1.4 - 7.0 x10E3/uL   Lymphocytes Absolute 2.1 0.7 - 3.1 x10E3/uL   Monocytes Absolute 0.5 0.1 - 0.9 x10E3/uL   EOS (ABSOLUTE) 0.2 0.0 - 0.4 x10E3/uL   Basophils Absolute 0.1 0.0 - 0.2 x10E3/uL   Immature Granulocytes 0 Not Estab. %   Immature Grans (Abs) 0.0 0.0 - 0.1 [id number]  Basic metabolic panel  Result Value Ref Range   Glucose 108 (H) 65 - 99 mg/dL   BUN 9 6 - 24 mg/dL   Creatinine, Ser 0.75 0.57 - 1.00 mg/dL   eGFR 97 >59 mL/min/1.73   BUN/Creatinine Ratio 12 9 - 23   Sodium 139 134 - 144 mmol/L   Potassium 3.4 (L) 3.5 - 5.2 mmol/L   Chloride 98 96 - 106 mmol/L   CO2 23 20 - 29 mmol/L   Calcium 9.8 8.7 - 10.2 mg/dL  TSH  Result Value Ref Range   TSH 2.110 0.450 - 4.500 uIU/mL  VITAMIN D 25 Hydroxy (Vit-D Deficiency, Fractures)  Result Value Ref Range   Vit D, 25-Hydroxy 10.9 (L) 30.0 - 100.0 ng/mL  Urinalysis, Routine w reflex microscopic  Result  Value Ref Range   Specific Gravity, UA 1.015 1.005 - 1.030   pH, UA 6.0 5.0 - 7.5   Color, UA Yellow Yellow   Appearance Ur Cloudy (A) Clear   Leukocytes,UA 1+ (A) Negative   Protein,UA  Negative Negative/Trace   Glucose, UA Negative Negative   Ketones, UA Negative Negative   RBC, UA 1+ (A) Negative   Bilirubin, UA Negative Negative   Urobilinogen, Ur 0.2 0.2 - 1.0 mg/dL   Nitrite, UA Negative Negative   Microscopic Examination See below:       Assessment & Plan:   Problem List Items Addressed This Visit       Cardiovascular and Mediastinum   Essential hypertension    Chronic.  Patient has been out of medication but when she was taking hydralazine TID she was having low blood pressures.  Will change Hydralazine to BID.  Continue to monitor blood pressures at home.  Bring log to next visit. Follow up in 2 months.       Relevant Medications   hydrALAZINE (APRESOLINE) 10 MG tablet     Nervous and Auditory   Meniere disease, left - Primary    Chronic. Improved due to anxiety being under control and drinking less soda. Continue with current medication regimen. Follow up in 2 months.         Other   Anxiety    Chronic. Improved with current medication regimen. Continue with Paxil and lorazepam PRN. Return to clinic in 2 months for reevaluation. Call sooner if concerns arise.          Follow up plan: Return in about 2 months (around 04/02/2021) for BP Check, anxiety.  >30 minutes spent with patient today

## 2021-02-09 ENCOUNTER — Encounter: Payer: Self-pay | Admitting: Nurse Practitioner

## 2021-02-09 ENCOUNTER — Telehealth (INDEPENDENT_AMBULATORY_CARE_PROVIDER_SITE_OTHER): Payer: Medicaid Other | Admitting: Nurse Practitioner

## 2021-02-09 DIAGNOSIS — J0100 Acute maxillary sinusitis, unspecified: Secondary | ICD-10-CM

## 2021-02-09 MED ORDER — AMOXICILLIN-POT CLAVULANATE 875-125 MG PO TABS: 1.0000 | ORAL_TABLET | Freq: Two times a day (BID) | ORAL | 0 refills | Status: DC

## 2021-02-09 NOTE — Progress Notes (Signed)
Acute Office Visit  Subjective:    Patient ID: Dawn Taylor, female    DOB: [birth year], 50 y.o.   MRN: [id number]  Chief Complaint  Patient presents with   URI    Pt states she has been having sinus pressure, congestion, green phlegm, cough, and headache. States symptoms started about a week ago. No known exposure per patient.     HPI Patient is in today for nasal congestion and sinus pain about a week. States she had sinus infections in the past and this is how they feel. She has not taken a covid-19 test.   UPPER RESPIRATORY TRACT INFECTION  Worst symptom: sinus pain Fever: no Cough: yes Shortness of breath: no Wheezing: no Chest pain: no Chest tightness: no Chest congestion: no Nasal congestion: yes Runny nose: yes Post nasal drip: no Sneezing: no Sore throat: no Swollen glands: no Sinus pressure: yes Headache: yes Face pain: yes Toothache: no Ear pain: no  Ear pressure: no  Eyes red/itching:no Eye drainage/crusting: no  Vomiting: no Rash: no Fatigue: yes Sick contacts: no Strep contacts: no  Context: worse Recurrent sinusitis: no Relief with OTC cold/cough medications:  mild   Treatments attempted: zyrtec D, saline, ibuprofen, tylenol   Past Medical History:  Diagnosis Date   Anxiety    Dizziness    Hypertension    Meniere disease     Past Surgical History:  Procedure Laterality Date   CESAREAN SECTION     TONSILLECTOMY      Family History  Problem Relation Age of Onset   Diabetes Mother    Hypertension Mother    Heart disease Father    Cancer Sister    Anxiety disorder Brother    Anxiety disorder Daughter    Hypertension Maternal Grandmother    Hypertension Maternal Grandfather    Colon cancer Paternal Grandfather     Social History   Socioeconomic History   Marital status: Legally Separated    Spouse name: Not on file   Number of children: Not on file   Years of education: Not on file   Highest education level: Not on file   Occupational History   Not on file  Tobacco Use   Smoking status: Never   Smokeless tobacco: Never  Vaping Use   Vaping Use: Never used  Substance and Sexual Activity   Alcohol use: No   Drug use: No   Sexual activity: Not Currently  Other Topics Concern   Not on file  Social History Narrative   Not on file   Social Determinants of Health   Financial Resource Strain: Not on file  Food Insecurity: Not on file  Transportation Needs: Not on file  Physical Activity: Not on file  Stress: Not on file  Social Connections: Not on file  Intimate Partner Violence: Not on file    Outpatient Medications Prior to Visit  Medication Sig Dispense Refill   albuterol (VENTOLIN HFA) 108 (90 Base) MCG/ACT inhaler Inhale 2 puffs into the lungs every 6 (six) hours as needed for wheezing or shortness of breath. 18 g 2   amLODipine (NORVASC) 5 MG tablet Take 5 mg by mouth daily.     EPINEPHrine (EPIPEN 2-PAK) 0.3 mg/0.3 mL IJ SOAJ injection Inject 0.3 mLs (0.3 mg total) into the muscle as needed for anaphylaxis. 1 each 0   fluticasone (FLONASE) 50 MCG/ACT nasal spray SHAKE LIQUID AND USE 2 SPRAYS IN EACH NOSTRIL TWICE DAILY 16 g 6   hydrALAZINE (APRESOLINE) 10 MG  tablet Take 1 tablet (10 mg total) by mouth 2 (two) times daily. 60 tablet 0   hydrochlorothiazide (HYDRODIURIL) 25 MG tablet TAKE 1 TABLET(25 MG) BY MOUTH DAILY 30 tablet 0   LORazepam (ATIVAN) 0.5 MG tablet Take 1 tablet (0.5 mg total) by mouth 2 (two) times daily as needed for anxiety. 30 tablet 1   meclizine (ANTIVERT) 25 MG tablet Take 1 tablet (25 mg total) by mouth 3 (three) times daily as needed for dizziness. 30 tablet 0   pantoprazole (PROTONIX) 20 MG tablet Take 1 tablet (20 mg total) by mouth daily. 90 tablet 1   PARoxetine (PAXIL) 10 MG tablet Take 1 tablet (10 mg total) by mouth daily. 90 tablet 0   rosuvastatin (CRESTOR) 5 MG tablet Take 1 tablet (5 mg total) by mouth daily. 90 tablet 1   scopolamine (TRANSDERM-SCOP, 1.5  MG,) 1 MG/3DAYS Place 1 patch (1.5 mg total) onto the skin every 3 (three) days. 10 patch 1   Vitamin D, Ergocalciferol, (DRISDOL) 1.25 MG (50000 UNIT) CAPS capsule Take 1 capsule (50,000 Units total) by mouth every 7 (seven) days. 12 capsule 1   No facility-administered medications prior to visit.    Allergies  Allergen Reactions   Ivp Dye [Iodinated Diagnostic Agents] Anaphylaxis    Pt reports after receiving IV dye she began to get hives all over her body and then her throat began to swell;    Benadryl [Diphenhydramine] Swelling   Lisinopril Swelling    angioedema   Metoprolol Other (See Comments)    "Made me feel bad"    Review of Systems  Constitutional:  Positive for fatigue. Negative for fever.  HENT:  Positive for congestion, rhinorrhea, sinus pressure and sinus pain. Negative for ear pain, postnasal drip and sore throat.   Eyes: Negative.   Respiratory:  Positive for cough ("throat clearing"). Negative for shortness of breath.   Cardiovascular: Negative.   Gastrointestinal: Negative.   Genitourinary: Negative.   Musculoskeletal: Negative.   Skin: Negative.   Neurological: Negative.       Objective:    Physical Exam Vitals and nursing note reviewed.  Constitutional:      General: She is not in acute distress.    Appearance: Normal appearance.  HENT:     Head: Normocephalic.     Comments: Pain when she touches her maxillary sinuses Eyes:     Conjunctiva/sclera: Conjunctivae normal.  Pulmonary:     Effort: Pulmonary effort is normal.     Comments: Able to talk in complete sentences Neurological:     Mental Status: She is alert and oriented to person, place, and time.  Psychiatric:        Mood and Affect: Mood normal.        Behavior: Behavior normal.        Thought Content: Thought content normal.        Judgment: Judgment normal.    There were no vitals taken for this visit. Wt Readings from Last 3 Encounters:  01/31/21 192 lb 3.2 oz (87.2 kg)  12/28/20  192 lb (87.1 kg)  12/21/20 190 lb 6.4 oz (86.4 kg)    Health Maintenance Due  Topic Date Due   Pneumococcal Vaccine 78-7 Years old (1 - PCV) Never done   Zoster Vaccines- Shingrix (1 of 2) Never done   PAP SMEAR-Modifier  Never done    There are no preventive care reminders to display for this patient.   Lab Results  Component Value Date  TSH 2.110 12/21/2020   Lab Results  Component Value Date   WBC 7.3 12/21/2020   HGB 13.9 12/21/2020   HCT 40.3 12/21/2020   MCV 91 12/21/2020   PLT 307 12/21/2020   Lab Results  Component Value Date   NA 139 12/21/2020   K 3.4 (L) 12/21/2020   CO2 23 12/21/2020   GLUCOSE 108 (H) 12/21/2020   BUN 9 12/21/2020   CREATININE 0.75 12/21/2020   BILITOT 0.3 12/14/2020   ALKPHOS 57 12/14/2020   AST 37 12/14/2020   ALT 35 (H) 12/14/2020   PROT 7.2 12/14/2020   ALBUMIN 4.5 12/14/2020   CALCIUM 9.8 12/21/2020   ANIONGAP 9 11/24/2016   EGFR 97 12/21/2020   Lab Results  Component Value Date   CHOL 216 (H) 12/14/2020   Lab Results  Component Value Date   HDL 30 (L) 12/14/2020   Lab Results  Component Value Date   LDLCALC 121 (H) 12/14/2020   Lab Results  Component Value Date   TRIG 369 (H) 12/14/2020   Lab Results  Component Value Date   CHOLHDL 7.2 (H) 12/14/2020   Lab Results  Component Value Date   HGBA1C 5.2 12/21/2020       Assessment & Plan:   Problem List Items Addressed This Visit       Respiratory   Sinusitis - Primary    She will take a home covid-19 test and call with the results. Will treat with augmentin. Encourage fluids. Continue saline, zyrtec, ibuprofen/tylenol. F/U if symptoms don't improve or worsen.       Relevant Medications   amoxicillin-clavulanate (AUGMENTIN) 875-125 MG tablet     Meds ordered this encounter  Medications   amoxicillin-clavulanate (AUGMENTIN) 875-125 MG tablet    Sig: Take 1 tablet by mouth 2 (two) times daily.    Dispense:  20 tablet    Refill:  0     This  visit was completed via MyChart due to the restrictions of the COVID-19 pandemic. All issues as above were discussed and addressed. Physical exam was done as above through visual confirmation on MyChart. If it was felt that the patient should be evaluated in the office, they were directed there. The patient verbally consented to this visit. Location of the patient: home Location of the provider: work Those involved with this call:  Provider: Billy Fischer, DNP CMA: Yvonna Alanis, [place] Desk/Registration: Jill Side  Time spent on call:  10 minutes with patient face to face via video conference. More than 50% of this time was spent in counseling and coordination of care. 10 minutes total spent in review of patient's record and preparation of their chart.   Charyl Dancer, NP

## 2021-02-09 NOTE — Assessment & Plan Note (Addendum)
She will take a home covid-19 test and call with the results. Will treat with augmentin. Encourage fluids. Continue saline, zyrtec, ibuprofen/tylenol. F/U if symptoms don't improve or worsen.

## 2021-03-25 ENCOUNTER — Other Ambulatory Visit: Payer: Self-pay | Admitting: Nurse Practitioner

## 2021-03-25 NOTE — Telephone Encounter (Signed)
Requested Prescriptions  Pending Prescriptions Disp Refills  . PARoxetine (PAXIL) 10 MG tablet [Pharmacy Med Name: PAROXETINE '10MG'$  TABLETS] 90 tablet 0    Sig: TAKE 1 TABLET(10 MG) BY MOUTH DAILY     Psychiatry:  Antidepressants - SSRI Passed - 03/25/2021  3:11 AM      Passed - Valid encounter within last 6 months    Recent Outpatient Visits          1 month ago Acute non-recurrent maxillary sinusitis   [facility] McElwee, Lauren A, NP   1 month ago Meniere disease, left   [facility] Jon Billings, NP   2 months ago [place], Karen, NP   3 months ago Disorientation   [place], [place], DO   3 months ago Meniere disease, left   [facility] Jon Billings, NP      Future Appointments            In 1 week Jon Billings, NP [facility] [place]

## 2021-04-02 ENCOUNTER — Ambulatory Visit: Payer: Medicaid Other | Admitting: Nurse Practitioner

## 2021-04-02 NOTE — Progress Notes (Deleted)
There were no vitals taken for this visit.   Subjective:    Patient ID: Dawn Taylor, female    DOB: 03/19/1971, 50 y.o.   MRN: [id number]  HPI: Dawn Taylor is a 50 y.o. female  No chief complaint on file.  HYPERTENSION Hypertension status: {Blank single:19197::"controlled","uncontrolled","better","worse","exacerbated","stable"}  Satisfied with current treatment? {Blank single:19197::"yes","no"} Duration of hypertension: {Blank single:19197::"chronic","months","years"} BP monitoring frequency:  {Blank single:19197::"not checking","rarely","daily","weekly","monthly","a few times a day","a few times a week","a few times a month"} BP range:  BP medication side effects:  {Blank single:19197::"yes","no"} Medication compliance: {Blank single:19197::"excellent compliance","good compliance","fair compliance","poor compliance"} Previous BP meds:{Blank WYOVZCHY:85027::"XAJO","INOMVEHMCN","OBSJGGEZMO/QHUTMLYYTK","PTWSFKCL","EXNTZGYFVC","BSWHQPRFFM/BWGY","KZLDJTTSVX (bystolic)","carvedilol","chlorthalidone","clonidine","diltiazem","exforge HCT","HCTZ","irbesartan (avapro)","labetalol","lisinopril","lisinopril-HCTZ","losartan (cozaar)","methyldopa","nifedipine","olmesartan (benicar)","olmesartan-HCTZ","quinapril","ramipril","spironalactone","tekturna","valsartan","valsartan-HCTZ","verapamil"} Aspirin: {Blank single:19197::"yes","no"} Recurrent headaches: {Blank single:19197::"yes","no"} Visual changes: {Blank single:19197::"yes","no"} Palpitations: {Blank single:19197::"yes","no"} Dyspnea: {Blank single:19197::"yes","no"} Chest pain: {Blank single:19197::"yes","no"} Lower extremity edema: {Blank single:19197::"yes","no"} Dizzy/lightheaded: {Blank single:19197::"yes","no"}  ANXIETY Relevant past medical, surgical, family and social history reviewed and updated as indicated. Interim medical history since our last visit reviewed. Allergies and medications reviewed and updated.  Review of  Systems  Per HPI unless specifically indicated above     Objective:    There were no vitals taken for this visit.  Wt Readings from Last 3 Encounters:  01/31/21 192 lb 3.2 oz (87.2 kg)  12/28/20 192 lb (87.1 kg)  12/21/20 190 lb 6.4 oz (86.4 kg)    Physical Exam  Results for orders placed or performed in visit on 12/21/20  Urine Culture   Specimen: Urine   UR  Result Value Ref Range   Urine Culture, Routine Final report    Organism ID, Bacteria Comment   Microscopic Examination   BLD  Result Value Ref Range   WBC, UA 0-5 0 - 5 /hpf   RBC 0-2 0 - 2 /hpf   Epithelial Cells (non renal) 0-10 0 - 10 /hpf   Mucus, UA Present (A) Not Estab.   Bacteria, UA Few (A) None seen/Few  Bayer DCA Hb A1c Waived  Result Value Ref Range   HB A1C (BAYER DCA - WAIVED) 5.2 <7.0 %  CBC with Differential/Platelet  Result Value Ref Range   WBC 7.3 3.4 - 10.8 x10E3/uL   RBC 4.41 3.77 - 5.28 x10E6/uL   Hemoglobin 13.9 11.1 - 15.9 g/dL   Hematocrit 40.3 34.0 - 46.6 %   MCV 91 79 - 97 fL   MCH 31.5 26.6 - 33.0 pg   MCHC 34.5 31.5 - 35.7 g/dL   RDW 12.9 11.7 - 15.4 %   Platelets 307 150 - 450 x10E3/uL   Neutrophils 61 Not Estab. %   Lymphs 29 Not Estab. %   Monocytes 7 Not Estab. %   Eos 2 Not Estab. %   Basos 1 Not Estab. %   Neutrophils Absolute 4.5 1.4 - 7.0 x10E3/uL   Lymphocytes Absolute 2.1 0.7 - 3.1 x10E3/uL   Monocytes Absolute 0.5 0.1 - 0.9 x10E3/uL   EOS (ABSOLUTE) 0.2 0.0 - 0.4 x10E3/uL   Basophils Absolute 0.1 0.0 - 0.2 x10E3/uL   Immature Granulocytes 0 Not Estab. %   Immature Grans (Abs) 0.0 0.0 - 0.1 [id number]  Basic metabolic panel  Result Value Ref Range   Glucose 108 (H) 65 - 99 mg/dL   BUN 9 6 - 24 mg/dL   Creatinine, Ser 0.75 0.57 - 1.00 mg/dL   eGFR 97 >59 mL/min/1.73   BUN/Creatinine Ratio 12 9 - 23   Sodium 139 134 - 144 mmol/L   Potassium 3.4 (L) 3.5 - 5.2 mmol/L   Chloride 98 96 -  106 mmol/L   CO2 23 20 - 29 mmol/L   Calcium 9.8 8.7 - 10.2 mg/dL  TSH   Result Value Ref Range   TSH 2.110 0.450 - 4.500 uIU/mL  VITAMIN D 25 Hydroxy (Vit-D Deficiency, Fractures)  Result Value Ref Range   Vit D, 25-Hydroxy 10.9 (L) 30.0 - 100.0 ng/mL  Urinalysis, Routine w reflex microscopic  Result Value Ref Range   Specific Gravity, UA 1.015 1.005 - 1.030   pH, UA 6.0 5.0 - 7.5   Color, UA Yellow Yellow   Appearance Ur Cloudy (A) Clear   Leukocytes,UA 1+ (A) Negative   Protein,UA Negative Negative/Trace   Glucose, UA Negative Negative   Ketones, UA Negative Negative   RBC, UA 1+ (A) Negative   Bilirubin, UA Negative Negative   Urobilinogen, Ur 0.2 0.2 - 1.0 mg/dL   Nitrite, UA Negative Negative   Microscopic Examination See below:       Assessment & Plan:   Problem List Items Addressed This Visit       Cardiovascular and Mediastinum   Essential hypertension     Other   Anxiety - Primary     Follow up plan: No follow-ups on file.

## 2021-05-05 ENCOUNTER — Other Ambulatory Visit: Payer: Self-pay | Admitting: Nurse Practitioner

## 2021-08-20 ENCOUNTER — Other Ambulatory Visit: Payer: Self-pay | Admitting: Family Medicine

## 2021-08-20 ENCOUNTER — Other Ambulatory Visit: Payer: Self-pay | Admitting: Nurse Practitioner

## 2021-08-20 NOTE — Telephone Encounter (Signed)
Requested medication (s) are due for refill today: Yes  Requested medication (s) are on the active medication list: Yes  Last refill:  12/22/20  Future visit scheduled: Yes  Notes to clinic:  See request.    Requested Prescriptions  Pending Prescriptions Disp Refills   Vitamin D, Ergocalciferol, (DRISDOL) 1.25 MG (50000 UNIT) CAPS capsule [Pharmacy Med Name: VITAMIN D2 50,000IU (ERGO) CAP RX] 12 capsule 1    Sig: TAKE 1 CAPSULE BY MOUTH EVERY 7 DAYS     Endocrinology:  Vitamins - Vitamin D Supplementation Failed - 08/20/2021  3:11 AM      Failed - 50,000 IU strengths are not delegated      Failed - Phosphate in normal range and within 360 days    No results found for: PHOS        Failed - Vitamin D in normal range and within 360 days    Vit D, 25-Hydroxy  Date Value Ref Range Status  12/21/2020 10.9 (L) 30.0 - 100.0 ng/mL Final    Comment:    Vitamin D deficiency has been defined by the Institute of Medicine and an Endocrine Society practice guideline as a level of serum 25-OH vitamin D less than 20 ng/mL (1,2). The Endocrine Society went on to further define vitamin D insufficiency as a level between 21 and 29 ng/mL (2). 1. IOM (Institute of Medicine). 2010. Dietary reference    intakes for calcium and D. [place]: The    [employer]. 2. Holick MF, Binkley [place], Bischoff-Ferrari HA, et al.    Evaluation, treatment, and prevention of vitamin D    deficiency: an Endocrine Society clinical practice    guideline. JCEM. 2011 Jul; 96(7):1911-30.           Passed - Ca in normal range and within 360 days    Calcium  Date Value Ref Range Status  12/21/2020 9.8 8.7 - 10.2 mg/dL Final          Passed - Valid encounter within last 12 months    Recent Outpatient Visits           6 months ago Acute non-recurrent maxillary sinusitis   [place], NP   6 months ago Meniere disease, left   [facility] Jon Billings, NP   7 months ago [place], Karen, NP   8 months ago Disorientation   [facility] [place], DO   8 months ago Meniere disease, left   [facility] Jon Billings, NP

## 2021-08-20 NOTE — Telephone Encounter (Signed)
Requested medication (s) are due for refill today: yes  Requested medication (s) are on the active medication list: yes  Last refill:  12/28/20 #30/1  Future visit scheduled: yes  Notes to clinic: Unable to refill per protocol, cannot delegate. No urine drug screen on file     Requested Prescriptions  Pending Prescriptions Disp Refills   LORazepam (ATIVAN) 0.5 MG tablet [Pharmacy Med Name: LORAZEPAM 0.5MG  TABLETS] 30 tablet     Sig: TAKE 1 TABLET(0.5 MG) BY MOUTH TWICE DAILY AS NEEDED FOR ANXIETY     Not Delegated - Psychiatry:  Anxiolytics/Hypnotics Failed - 08/20/2021 12:44 PM      Failed - This refill cannot be delegated      Failed - Urine Drug Screen completed in last 360 days      Failed - Valid encounter within last 6 months    Recent Outpatient Visits           6 months ago Acute non-recurrent maxillary sinusitis   [facility] McElwee, Lauren A, NP   6 months ago Meniere disease, left   [facility] Jon Billings, NP   7 months ago [place], Karen, NP   8 months ago Disorientation   [facility], [place], DO   8 months ago Meniere disease, left   [facility] Jon Billings, NP       Future Appointments             In 3 days Jon Billings, NP [facility], [place]

## 2021-08-23 ENCOUNTER — Ambulatory Visit: Payer: Medicaid Other | Admitting: Nurse Practitioner

## 2021-08-23 NOTE — Progress Notes (Deleted)
There were no vitals taken for this visit.   Subjective:    Patient ID: Dawn Taylor, female    DOB: Sep 12, 1970, 51 y.o.   MRN: [id number]  HPI: Dawn Taylor is a 51 y.o. female  No chief complaint on file.  HYPERTENSION / HYPERLIPIDEMIA Satisfied with current treatment? {Blank single:19197::"yes","no"} Duration of hypertension: {Blank single:19197::"chronic","months","years"} BP monitoring frequency: {Blank single:19197::"not checking","rarely","daily","weekly","monthly","a few times a day","a few times a week","a few times a month"} BP range:  BP medication side effects: {Blank single:19197::"yes","no"} Past BP meds: {Blank WUJWJXBJ:47829::"FAOZ","HYQMVHQION","GEXBMWUXLK/GMWNUUVOZD","GUYQIHKV","QQVZDGLOVF","IEPPIRJJOA/CZYS","AYTKZSWFUX (bystolic)","carvedilol","chlorthalidone","clonidine","diltiazem","exforge HCT","HCTZ","irbesartan (avapro)","labetalol","lisinopril","lisinopril-HCTZ","losartan (cozaar)","methyldopa","nifedipine","olmesartan (benicar)","olmesartan-HCTZ","quinapril","ramipril","spironalactone","tekturna","valsartan","valsartan-HCTZ","verapamil"} Duration of hyperlipidemia: {Blank single:19197::"chronic","months","years"} Cholesterol medication side effects: {Blank single:19197::"yes","no"} Cholesterol supplements: {Blank multiple:19196::"none","fish oil","niacin","red yeast rice"} Past cholesterol medications: {Blank multiple:19196::"none","atorvastain (lipitor)","lovastatin (mevacor)","pravastatin (pravachol)","rosuvastatin (crestor)","simvastatin (zocor)","vytorin","fenofibrate (tricor)","gemfibrozil","ezetimide (zetia)","niaspan","lovaza"} Medication compliance: {Blank single:19197::"excellent compliance","good compliance","fair compliance","poor compliance"} Aspirin: {Blank single:19197::"yes","no"} Recent stressors: {Blank single:19197::"yes","no"} Recurrent headaches: {Blank single:19197::"yes","no"} Visual changes: {Blank  single:19197::"yes","no"} Palpitations: {Blank single:19197::"yes","no"} Dyspnea: {Blank single:19197::"yes","no"} Chest pain: {Blank single:19197::"yes","no"} Lower extremity edema: {Blank single:19197::"yes","no"} Dizzy/lightheaded: {Blank single:19197::"yes","no"}  Menire's Disease  ANXIETY Patient states she is feeling a lot better. Patient states she feels like the Paxil is working well for her. She has switched from drinking Dr. Malachi Bonds and is now drinking water only. States she feels like this is helping her anxiety and menieres.  As her anxiety is getting better she has decreased her use of lorazepam to every other day.   Patient states she is checking blood pressures at home and they are running 140/90s right now because she has been out of medication. When she was taking Hydralazine TID it was dropping her systolic into the 32T and she wasn't feeling well.    Denies HA, CP, SOB, dizziness, palpitations, visual changes, and lower extremity swelling.    Relevant past medical, surgical, family and social history reviewed and updated as indicated. Interim medical history since our last visit reviewed. Allergies and medications reviewed and updated.  Review of Systems  Eyes:  Negative for visual disturbance.  Respiratory:  Negative for cough, chest tightness and shortness of breath.   Cardiovascular:  Negative for chest pain, palpitations and leg swelling.  Neurological:  Positive for dizziness. Negative for headaches.  Psychiatric/Behavioral:  Negative for suicidal ideas. The patient is nervous/anxious.    Per HPI unless specifically indicated above     Objective:    There were no vitals taken for this visit.  Wt Readings from Last 3 Encounters:  01/31/21 192 lb 3.2 oz (87.2 kg)  12/28/20 192 lb (87.1 kg)  12/21/20 190 lb 6.4 oz (86.4 kg)    Physical Exam Vitals and nursing note reviewed.  Constitutional:      General: She is not in acute distress.    Appearance:  Normal appearance. She is obese. She is not ill-appearing, toxic-appearing or diaphoretic.  HENT:     Head: Normocephalic and atraumatic.     Right Ear: External ear normal.     Left Ear: External ear normal.     Nose: Nose normal.     Mouth/Throat:     Mouth: Mucous membranes are moist.     Pharynx: Oropharynx is clear.  Eyes:     General: No scleral icterus.       Right eye: No discharge.        Left eye: No discharge.     Extraocular Movements: Extraocular movements intact.     Conjunctiva/sclera: Conjunctivae normal.     Pupils: Pupils are equal, round, and reactive to light.  Cardiovascular:  Rate and Rhythm: Normal rate and regular rhythm.     Pulses: Normal pulses.     Heart sounds: Normal heart sounds. No murmur heard.   No friction rub. No gallop.  Pulmonary:     Effort: Pulmonary effort is normal. No respiratory distress.     Breath sounds: Normal breath sounds. No stridor. No wheezing, rhonchi or rales.  Chest:     Chest wall: No tenderness.  Musculoskeletal:        General: Normal range of motion.     Cervical back: Normal range of motion and neck supple.  Skin:    General: Skin is warm and dry.     Capillary Refill: Capillary refill takes less than 2 seconds.     Coloration: Skin is not jaundiced or pale.     Findings: No bruising, erythema, lesion or rash.  Neurological:     General: No focal deficit present.     Mental Status: She is alert and oriented to person, place, and time. Mental status is at baseline.  Psychiatric:        Mood and Affect: Mood normal.        Behavior: Behavior normal.        Thought Content: Thought content normal.        Judgment: Judgment normal.    Results for orders placed or performed in visit on 12/21/20  Urine Culture   Specimen: Urine   UR  Result Value Ref Range   Urine Culture, Routine Final report    Organism ID, Bacteria Comment   Microscopic Examination   BLD  Result Value Ref Range   WBC, UA 0-5 0 - 5 /hpf    RBC 0-2 0 - 2 /hpf   Epithelial Cells (non renal) 0-10 0 - 10 /hpf   Mucus, UA Present (A) Not Estab.   Bacteria, UA Few (A) None seen/Few  Bayer DCA Hb A1c Waived  Result Value Ref Range   HB A1C (BAYER DCA - WAIVED) 5.2 <7.0 %  CBC with Differential/Platelet  Result Value Ref Range   WBC 7.3 3.4 - 10.8 x10E3/uL   RBC 4.41 3.77 - 5.28 x10E6/uL   Hemoglobin 13.9 11.1 - 15.9 g/dL   Hematocrit 40.3 34.0 - 46.6 %   MCV 91 79 - 97 fL   MCH 31.5 26.6 - 33.0 pg   MCHC 34.5 31.5 - 35.7 g/dL   RDW 12.9 11.7 - 15.4 %   Platelets 307 150 - 450 x10E3/uL   Neutrophils 61 Not Estab. %   Lymphs 29 Not Estab. %   Monocytes 7 Not Estab. %   Eos 2 Not Estab. %   Basos 1 Not Estab. %   Neutrophils Absolute 4.5 1.4 - 7.0 x10E3/uL   Lymphocytes Absolute 2.1 0.7 - 3.1 x10E3/uL   Monocytes Absolute 0.5 0.1 - 0.9 x10E3/uL   EOS (ABSOLUTE) 0.2 0.0 - 0.4 x10E3/uL   Basophils Absolute 0.1 0.0 - 0.2 x10E3/uL   Immature Granulocytes 0 Not Estab. %   Immature Grans (Abs) 0.0 0.0 - 0.1 K24O9/BD  Basic metabolic panel  Result Value Ref Range   Glucose 108 (H) 65 - 99 mg/dL   BUN 9 6 - 24 mg/dL   Creatinine, Ser 0.75 0.57 - 1.00 mg/dL   eGFR 97 >59 mL/min/1.73   BUN/Creatinine Ratio 12 9 - 23   Sodium 139 134 - 144 mmol/L   Potassium 3.4 (L) 3.5 - 5.2 mmol/L   Chloride 98 96 - 106 mmol/L  CO2 23 20 - 29 mmol/L   Calcium 9.8 8.7 - 10.2 mg/dL  TSH  Result Value Ref Range   TSH 2.110 0.450 - 4.500 uIU/mL  VITAMIN D 25 Hydroxy (Vit-D Deficiency, Fractures)  Result Value Ref Range   Vit D, 25-Hydroxy 10.9 (L) 30.0 - 100.0 ng/mL  Urinalysis, Routine w reflex microscopic  Result Value Ref Range   Specific Gravity, UA 1.015 1.005 - 1.030   pH, UA 6.0 5.0 - 7.5   Color, UA Yellow Yellow   Appearance Ur Cloudy (A) Clear   Leukocytes,UA 1+ (A) Negative   Protein,UA Negative Negative/Trace   Glucose, UA Negative Negative   Ketones, UA Negative Negative   RBC, UA 1+ (A) Negative   Bilirubin, UA  Negative Negative   Urobilinogen, Ur 0.2 0.2 - 1.0 mg/dL   Nitrite, UA Negative Negative   Microscopic Examination See below:       Assessment & Plan:   Problem List Items Addressed This Visit       Cardiovascular and Mediastinum   Essential hypertension - Primary     Nervous and Auditory   Meniere disease, left     Other   Anxiety   Hypercholesteremia     Follow up plan: No follow-ups on file.  >30 minutes spent with patient today

## 2021-09-08 IMAGING — MR MR HEAD W/O CM
10 series · 48 of 48 positions shown · non-contrast
Comparison: CT head 08/20/2006.

CLINICAL DATA: Mental status change.  Possible TIA.

EXAM:
MRI HEAD WITHOUT CONTRAST
TECHNIQUE: Multiplanar, multiecho pulse sequences of the brain and surrounding
structures were obtained without intravenous contrast.

[Series 2: DWI · axial · 3.0mm · 1.20mm/px · z∈[-78,+84]mm · 8 of 110 slices shown (1 of 4)]
[im 1/110]
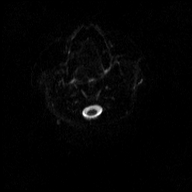
[im 16/110]
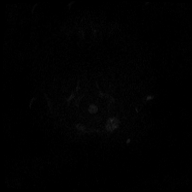
[im 32/110]
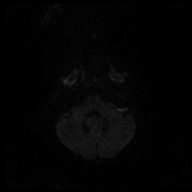
[im 47/110]
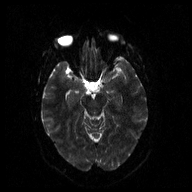
[im 63/110]
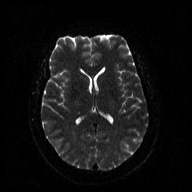
[im 78/110]
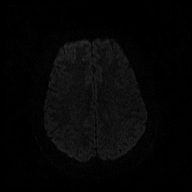
[im 94/110]
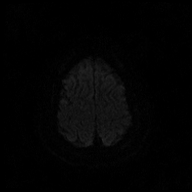
[im 110/110]
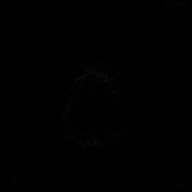

[Series 3: DWI · axial · 3.0mm · 1.20mm/px · z∈[-78,+84]mm · 4 of 55 slices shown (2 of 4)]
[im 1/55]
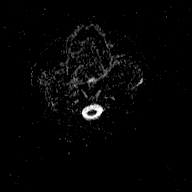
[im 19/55]
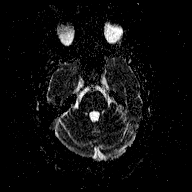
[im 37/55]
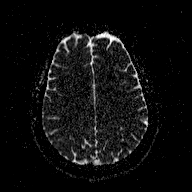
[im 55/55]
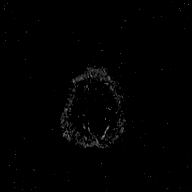

[Series 4: DWI · coronal · 3.0mm · 1.15mm/px · 7 of 90 slices shown (3 of 4)]
[im 1/90]
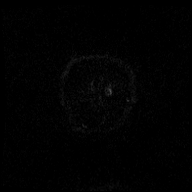
[im 15/90]
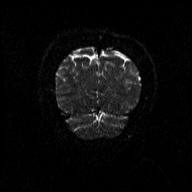
[im 30/90]
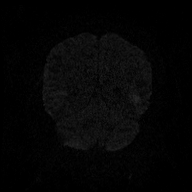
[im 45/90]
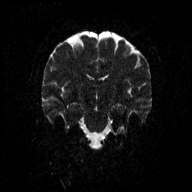
[im 60/90]
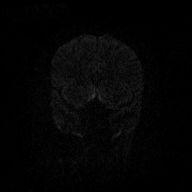
[im 75/90]
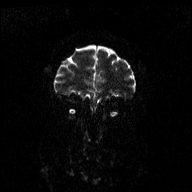
[im 90/90]
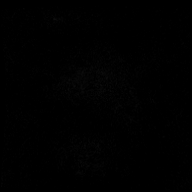

[Series 5: DWI · coronal · 3.0mm · 1.15mm/px · 4 of 45 slices shown (4 of 4)]
[im 1/45]
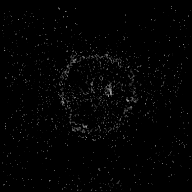
[im 15/45]
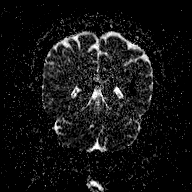
[im 30/45]
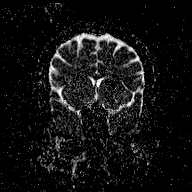
[im 45/45]
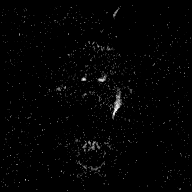

[Series 6: T1 · sagittal · 5.0mm · 0.45mm/px · 2 of 23 slices shown (1 of 2)]
[im 1/23]
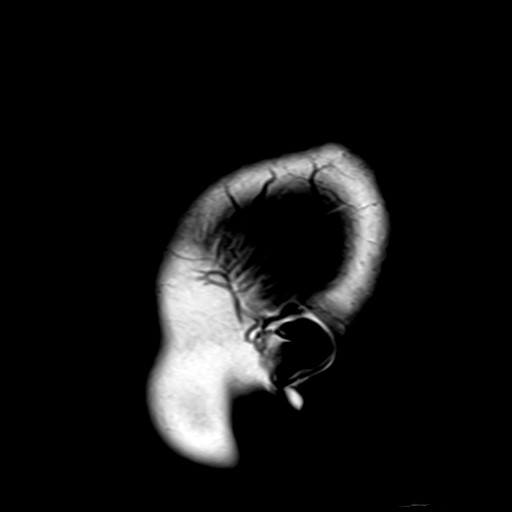
[im 23/23]
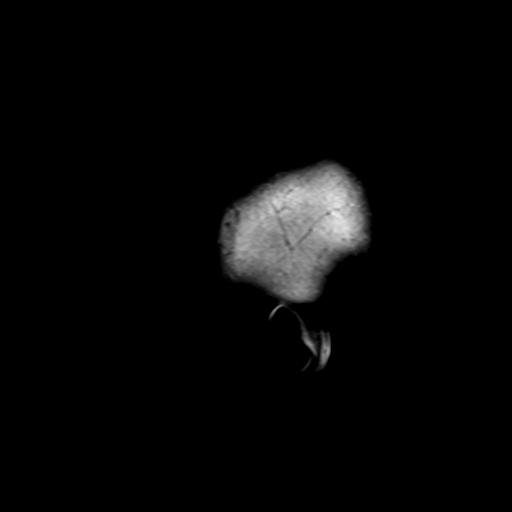

[Series 7: T2 · axial · 5.0mm · 0.90mm/px · z∈[-74,+80]mm · 2 of 23 slices shown (1 of 3)]
[im 1/23]
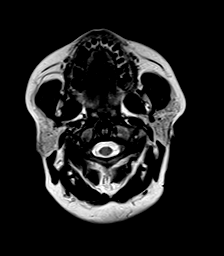
[im 23/23]
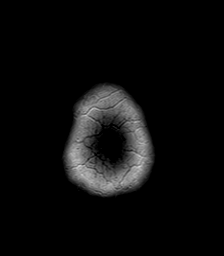

[Series 8: FLAIR · axial · 3.0mm · 0.45mm/px · z∈[-78,+84]mm · 4 of 55 slices shown]
[im 1/55]
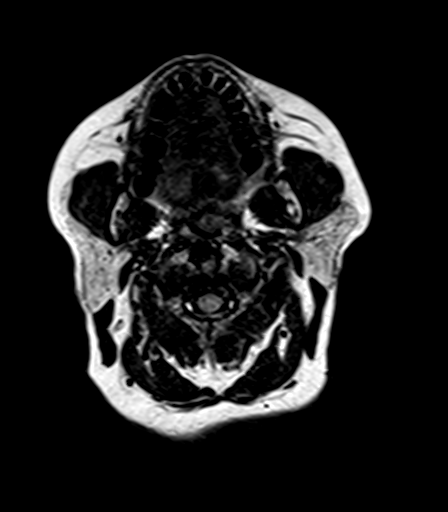
[im 19/55]
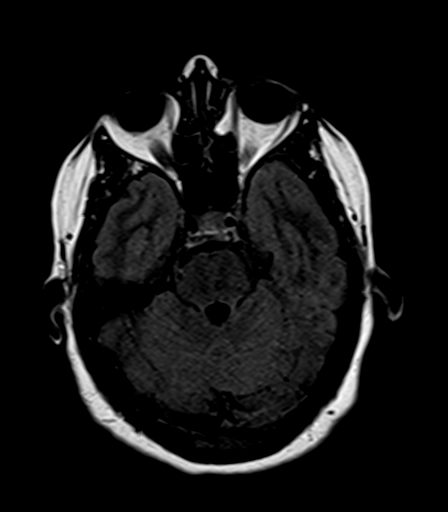
[im 37/55]
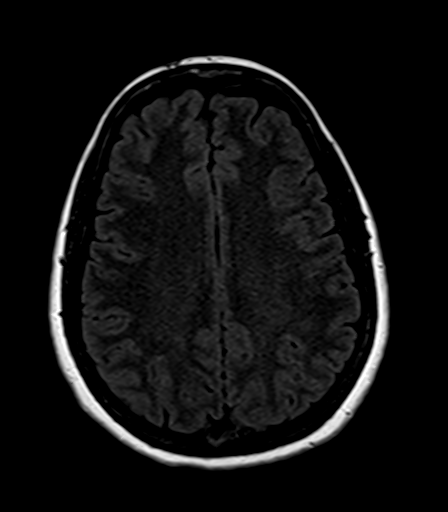
[im 55/55]
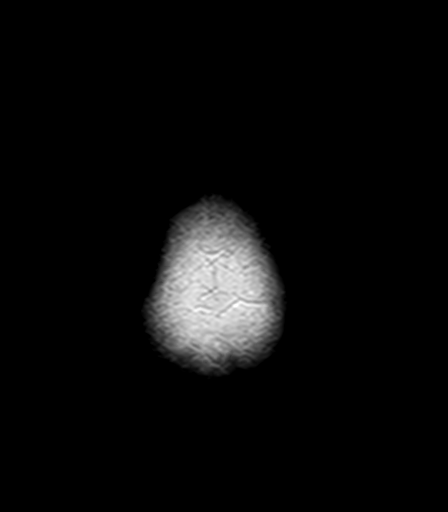

[Series 9: T2 · axial · 5.0mm · 0.72mm/px · z∈[-74,+80]mm · 2 of 23 slices shown (2 of 3)]
[im 1/23]
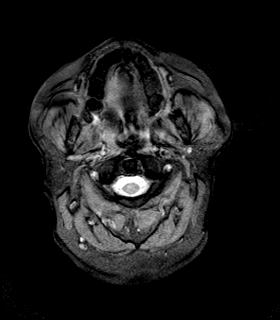
[im 23/23]
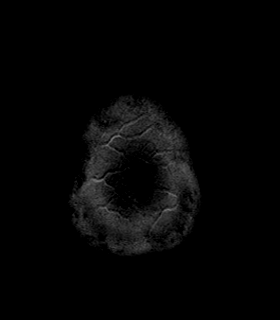

[Series 10: T1 · axial · 1.0mm · 1.00mm/px · z∈[-77,+82]mm · 13 of 160 slices shown (2 of 2)]
[im 1/160]
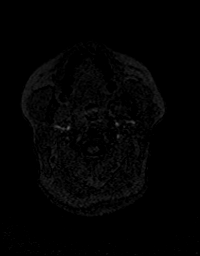
[im 14/160]
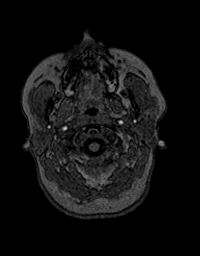
[im 27/160]
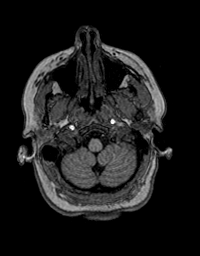
[im 40/160]
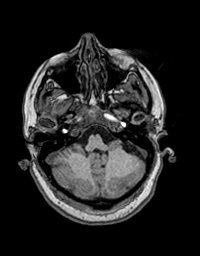
[im 54/160]
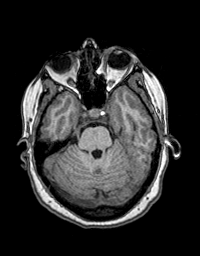
[im 67/160]
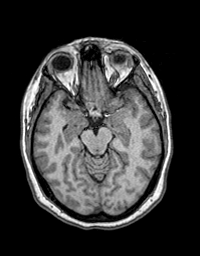
[im 80/160]
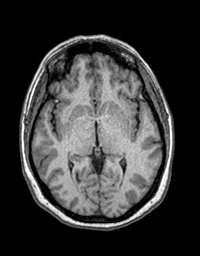
[im 93/160]
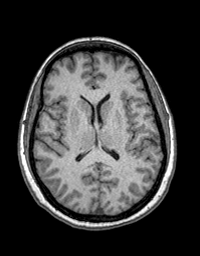
[im 107/160]
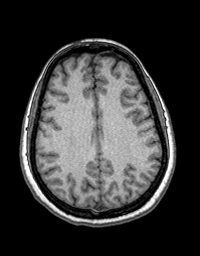
[im 120/160]
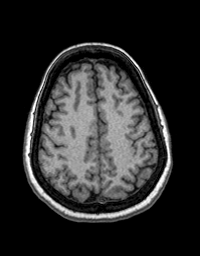
[im 133/160]
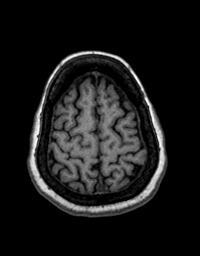
[im 146/160]
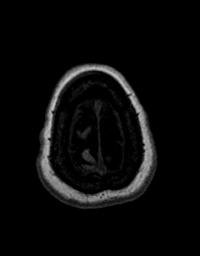
[im 160/160]
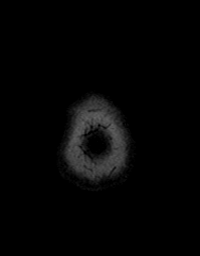

[Series 11: T2 · coronal · 5.0mm · 0.43mm/px · 2 of 29 slices shown (3 of 3)]
[im 1/29]
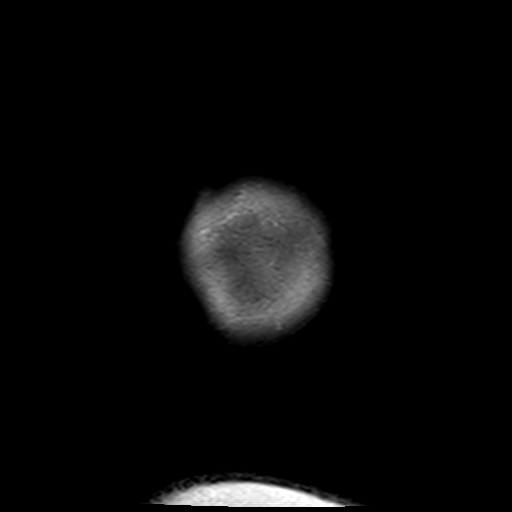
[im 29/29]
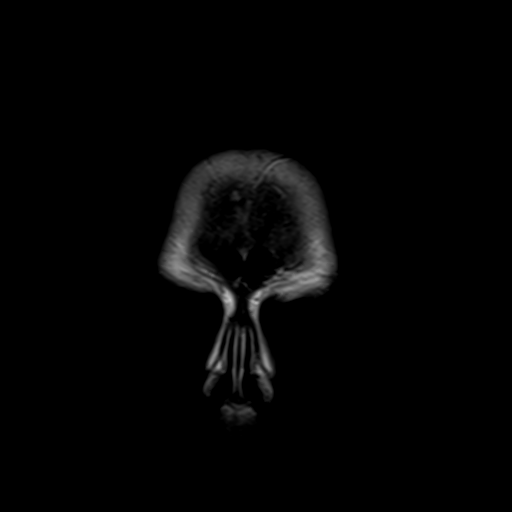

[48 of 48 positions shown; findings below may reference images not displayed]

FINDINGS: Brain: No acute infarction, hemorrhage, hydrocephalus, extra-axial
collection or mass lesion.

Vascular: Major arterial flow voids are maintained at the skull
base.

Skull and upper cervical spine: Normal marrow signal.

Sinuses/Orbits: Remote left medial orbital wall fracture, present on
prior CT head. Otherwise, unremarkable orbits. Clear sinuses.

Other: No mastoid effusions.
IMPRESSION: No evidence of acute intracranial abnormality. Specifically, no
acute infarct.

## 2022-01-29 ENCOUNTER — Ambulatory Visit: Payer: Self-pay | Admitting: *Deleted

## 2022-01-30 ENCOUNTER — Encounter: Payer: Self-pay | Admitting: Internal Medicine

## 2022-01-30 ENCOUNTER — Ambulatory Visit (INDEPENDENT_AMBULATORY_CARE_PROVIDER_SITE_OTHER): Payer: Medicaid Other | Admitting: Internal Medicine

## 2022-01-30 VITALS — BP 130/80 | HR 109 | Temp 98.3°F | Ht 62.99 in | Wt 187.8 lb

## 2022-01-30 DIAGNOSIS — R062 Wheezing: Secondary | ICD-10-CM | POA: Diagnosis not present

## 2022-01-30 DIAGNOSIS — J302 Other seasonal allergic rhinitis: Secondary | ICD-10-CM | POA: Diagnosis not present

## 2022-01-30 MED ORDER — FEXOFENADINE HCL 180 MG PO TABS: 180.0000 mg | ORAL_TABLET | Freq: Every day | ORAL | 1 refills | Status: DC

## 2022-01-30 MED ORDER — TRIAMCINOLONE ACETONIDE 40 MG/ML IJ SUSP
80.0000 mg | Freq: Once | INTRAMUSCULAR | Status: AC
  Administered 2022-01-30: 80 mg via INTRAMUSCULAR

## 2022-01-30 MED ORDER — ALBUTEROL SULFATE (2.5 MG/3ML) 0.083% IN NEBU
2.5000 mg | INHALATION_SOLUTION | Freq: Once | RESPIRATORY_TRACT | Status: AC
  Administered 2022-01-30: 2.5 mg via RESPIRATORY_TRACT

## 2022-01-30 MED ORDER — SPIRIVA RESPIMAT 1.25 MCG/ACT IN AERS: 2.0000 | INHALATION_SPRAY | Freq: Every day | RESPIRATORY_TRACT | 1 refills | Status: DC

## 2022-01-30 MED ORDER — METHYLPREDNISOLONE 4 MG PO TBPK: ORAL_TABLET | ORAL | 0 refills | Status: DC

## 2022-01-30 NOTE — Progress Notes (Signed)
BP 130/80   Pulse (!) 109   Temp 98.3 F (36.8 C) (Oral)   Ht 5' 2.99" (1.6 m)   Wt 187 lb 12.8 oz (85.2 kg)   SpO2 98%   BMI 33.28 kg/m    Subjective:    Patient ID: Dawn Taylor, female    DOB: 1970-09-11, 51 y.o.   MRN: [id number]  Chief Complaint  Patient presents with  . Chest congestion    Has had for the past month, comes and goes, worse at night and it is so bad that she has to give herself a breathing treatment.     HPI: Dawn Taylor is a 51 y.o. female  Patient presents with: Chest congestion: Has had for the past month, comes and goes, worse at night and it is so bad that she has to give herself a breathing treatment.     Cough This is a recurrent problem. Associated symptoms include shortness of breath and wheezing. Pertinent negatives include no chest pain, chills, ear congestion, ear pain, fever, headaches, heartburn, hemoptysis, myalgias, nasal congestion, postnasal drip, rash, rhinorrhea, sore throat, sweats or weight loss.    Chief Complaint  Patient presents with  . Chest congestion    Has had for the past month, comes and goes, worse at night and it is so bad that she has to give herself a breathing treatment.     Relevant past medical, surgical, family and social history reviewed and updated as indicated. Interim medical history since our last visit reviewed. Allergies and medications reviewed and updated.  Review of Systems  Constitutional:  Negative for chills, fever and weight loss.  HENT:  Negative for ear pain, postnasal drip, rhinorrhea and sore throat.   Respiratory:  Positive for cough, shortness of breath and wheezing. Negative for hemoptysis.   Cardiovascular:  Negative for chest pain.  Gastrointestinal:  Negative for heartburn.  Musculoskeletal:  Negative for myalgias.  Skin:  Negative for rash.  Neurological:  Negative for headaches.    Per HPI unless specifically indicated above     Objective:    BP 130/80   Pulse (!)  109   Temp 98.3 F (36.8 C) (Oral)   Ht 5' 2.99" (1.6 m)   Wt 187 lb 12.8 oz (85.2 kg)   SpO2 98%   BMI 33.28 kg/m   Wt Readings from Last 3 Encounters:  01/30/22 187 lb 12.8 oz (85.2 kg)  01/31/21 192 lb 3.2 oz (87.2 kg)  12/28/20 192 lb (87.1 kg)    Physical Exam  Results for orders placed or performed in visit on 12/21/20  Urine Culture   Specimen: Urine   UR  Result Value Ref Range   Urine Culture, Routine Final report    Organism ID, Bacteria Comment   Microscopic Examination   BLD  Result Value Ref Range   WBC, UA 0-5 0 - 5 /hpf   RBC, Urine 0-2 0 - 2 /hpf   Epithelial Cells (non renal) 0-10 0 - 10 /hpf   Mucus, UA Present (A) Not Estab.   Bacteria, UA Few (A) None seen/Few  Bayer DCA Hb A1c Waived  Result Value Ref Range   HB A1C (BAYER DCA - WAIVED) 5.2 <7.0 %  CBC with Differential/Platelet  Result Value Ref Range   WBC 7.3 3.4 - 10.8 x10E3/uL   RBC 4.41 3.77 - 5.28 x10E6/uL   Hemoglobin 13.9 11.1 - 15.9 g/dL   Hematocrit 40.3 34.0 - 46.6 %   MCV 91  79 - 97 fL   MCH 31.5 26.6 - 33.0 pg   MCHC 34.5 31.5 - 35.7 g/dL   RDW 12.9 11.7 - 15.4 %   Platelets 307 150 - 450 x10E3/uL   Neutrophils 61 Not Estab. %   Lymphs 29 Not Estab. %   Monocytes 7 Not Estab. %   Eos 2 Not Estab. %   Basos 1 Not Estab. %   Neutrophils Absolute 4.5 1.4 - 7.0 x10E3/uL   Lymphocytes Absolute 2.1 0.7 - 3.1 x10E3/uL   Monocytes Absolute 0.5 0.1 - 0.9 x10E3/uL   EOS (ABSOLUTE) 0.2 0.0 - 0.4 x10E3/uL   Basophils Absolute 0.1 0.0 - 0.2 x10E3/uL   Immature Granulocytes 0 Not Estab. %   Immature Grans (Abs) 0.0 0.0 - 0.1 [id number]/TK  Basic metabolic panel  Result Value Ref Range   Glucose 108 (H) 65 - 99 mg/dL   BUN 9 6 - 24 mg/dL   Creatinine, Ser 0.75 0.57 - 1.00 mg/dL   eGFR 97 >59 mL/min/1.73   BUN/Creatinine Ratio 12 9 - 23   Sodium 139 134 - 144 mmol/L   Potassium 3.4 (L) 3.5 - 5.2 mmol/L   Chloride 98 96 - 106 mmol/L   CO2 23 20 - 29 mmol/L   Calcium 9.8 8.7 - 10.2 mg/dL   TSH  Result Value Ref Range   TSH 2.110 0.450 - 4.500 uIU/mL  VITAMIN D 25 Hydroxy (Vit-D Deficiency, Fractures)  Result Value Ref Range   Vit D, 25-Hydroxy 10.9 (L) 30.0 - 100.0 ng/mL  Urinalysis, Routine w reflex microscopic  Result Value Ref Range   Specific Gravity, UA 1.015 1.005 - 1.030   pH, UA 6.0 5.0 - 7.5   Color, UA Yellow Yellow   Appearance Ur Cloudy (A) Clear   Leukocytes,UA 1+ (A) Negative   Protein,UA Negative Negative/Trace   Glucose, UA Negative Negative   Ketones, UA Negative Negative   RBC, UA 1+ (A) Negative   Bilirubin, UA Negative Negative   Urobilinogen, Ur 0.2 0.2 - 1.0 mg/dL   Nitrite, UA Negative Negative   Microscopic Examination See below:         Current Outpatient Medications:  .  albuterol (VENTOLIN HFA) 108 (90 Base) MCG/ACT inhaler, Inhale 2 puffs into the lungs every 6 (six) hours as needed for wheezing or shortness of breath., Disp: 18 g, Rfl: 2 .  amLODipine (NORVASC) 5 MG tablet, Take 5 mg by mouth daily., Disp: , Rfl:  .  EPINEPHrine (EPIPEN 2-PAK) 0.3 mg/0.3 mL IJ SOAJ injection, Inject 0.3 mLs (0.3 mg total) into the muscle as needed for anaphylaxis., Disp: 1 each, Rfl: 0 .  fexofenadine (ALLEGRA ALLERGY) 180 MG tablet, Take 1 tablet (180 mg total) by mouth daily., Disp: 10 tablet, Rfl: 1 .  fluticasone (FLONASE) 50 MCG/ACT nasal spray, SHAKE LIQUID AND USE 2 SPRAYS IN EACH NOSTRIL TWICE DAILY, Disp: 16 g, Rfl: 6 .  hydrALAZINE (APRESOLINE) 10 MG tablet, Take 1 tablet (10 mg total) by mouth 2 (two) times daily., Disp: 60 tablet, Rfl: 0 .  hydrochlorothiazide (HYDRODIURIL) 25 MG tablet, TAKE 1 TABLET(25 MG) BY MOUTH DAILY, Disp: 30 tablet, Rfl: 0 .  LORazepam (ATIVAN) 0.5 MG tablet, TAKE 1 TABLET(0.5 MG) BY MOUTH TWICE DAILY AS NEEDED FOR ANXIETY, Disp: 30 tablet, Rfl: 0 .  meclizine (ANTIVERT) 25 MG tablet, Take 1 tablet (25 mg total) by mouth 3 (three) times daily as needed for dizziness., Disp: 30 tablet, Rfl: 0 .  methylPREDNISolone  (MEDROL DOSEPAK)  4 MG TBPK tablet, Use as directed, Disp: 1 each, Rfl: 0 .  pantoprazole (PROTONIX) 20 MG tablet, TAKE 1 TABLET(20 MG) BY MOUTH DAILY, Disp: 90 tablet, Rfl: 1 .  PARoxetine (PAXIL) 10 MG tablet, TAKE 1 TABLET(10 MG) BY MOUTH DAILY, Disp: 90 tablet, Rfl: 0 .  rosuvastatin (CRESTOR) 5 MG tablet, Take 1 tablet (5 mg total) by mouth daily., Disp: 90 tablet, Rfl: 1 .  scopolamine (TRANSDERM-SCOP, 1.5 MG,) 1 MG/3DAYS, Place 1 patch (1.5 mg total) onto the skin every 3 (three) days., Disp: 10 patch, Rfl: 1 .  Tiotropium Bromide Monohydrate (SPIRIVA RESPIMAT) 1.25 MCG/ACT AERS, Inhale 2 puffs into the lungs daily., Disp: 1 each, Rfl: 1 .  Vitamin D, Ergocalciferol, (DRISDOL) 1.25 MG (50000 UNIT) CAPS capsule, TAKE 1 CAPSULE BY MOUTH EVERY 7 DAYS, Disp: 12 capsule, Rfl: 1    Assessment & Plan:  Acute bronchitis :  Wheezing noted will start pt on spiriva for such. Kenalog IM x 1 given to pt in office . pt advised to take Tylenol q 4- 6 hourly as needed. pt to take allegra q pm as needed and to call office if symptoms worsened pt verbalised understanding of such.     Problem List Items Addressed This Visit       Other   Wheezing - Primary     No orders of the defined types were placed in this encounter.    Meds ordered this encounter  Medications  . Tiotropium Bromide Monohydrate (SPIRIVA RESPIMAT) 1.25 MCG/ACT AERS    Sig: Inhale 2 puffs into the lungs daily.    Dispense:  1 each    Refill:  1  . methylPREDNISolone (MEDROL DOSEPAK) 4 MG TBPK tablet    Sig: Use as directed    Dispense:  1 each    Refill:  0  . fexofenadine (ALLEGRA ALLERGY) 180 MG tablet    Sig: Take 1 tablet (180 mg total) by mouth daily.    Dispense:  10 tablet    Refill:  1  . albuterol (PROVENTIL) (2.5 MG/3ML) 0.083% nebulizer solution 2.5 mg  . triamcinolone acetonide (KENALOG-40) injection 80 mg     Follow up plan: Return in about 2 weeks (around 02/13/2022).

## 2022-01-31 DIAGNOSIS — J302 Other seasonal allergic rhinitis: Secondary | ICD-10-CM | POA: Insufficient documentation

## 2022-02-04 ENCOUNTER — Encounter: Payer: Self-pay | Admitting: Nurse Practitioner

## 2022-02-04 NOTE — Telephone Encounter (Signed)
Let patient know that she would need an appointment in order to get antibiotics. Tried to offer her an appointment next available on Wednesday and patient hung up on me before we could schedule because se didn't want to schedule an appointment she just wanted to get the antibiotics.

## 2022-02-06 ENCOUNTER — Other Ambulatory Visit: Payer: Self-pay | Admitting: Family Medicine

## 2022-02-07 NOTE — Telephone Encounter (Signed)
Requested medication (s) are due for refill today: yes  Requested medication (s) are on the active medication list: no  Last refill:  08/17/20  Future visit scheduled: yes  Notes to clinic:  Unable to refill per protocol, Rx expired. Medication is not on the current med list.     Requested Prescriptions  Pending Prescriptions Disp Refills   PROAIR HFA 108 (90 Base) MCG/ACT inhaler [Pharmacy Med Name: PROAIR HFA ORAL INH (200  PFS) 8.5G] 8.5 g     Sig: INHALE 2 PUFFS INTO THE LUNGS EVERY 6 HOURS AS NEEDED FOR WHEEZING OR SHORTNESS OF BREATH     Pulmonology:  [facility] 2 Passed - 02/06/2022  2:43 PM      Passed - Last BP in normal range    BP Readings from Last 1 Encounters:  01/30/22 130/80         Passed - Last Heart Rate in normal range    Pulse Readings from Last 1 Encounters:  01/30/22 (!) 109         Passed - Valid encounter within last 12 months    Recent Outpatient Visits           1 week ago [place], MD   12 months ago Acute non-recurrent maxillary sinusitis   [facility] McElwee, Lauren A, NP   1 year ago Meniere disease, left   [facility] Jon Billings, NP   1 year ago [place], Karen, NP   1 year ago Disorientation   [place], [place], DO       Future Appointments             In 1 week Jon Billings, NP [facility], [place]

## 2022-02-10 ENCOUNTER — Ambulatory Visit (INDEPENDENT_AMBULATORY_CARE_PROVIDER_SITE_OTHER): Payer: Medicaid Other

## 2022-02-10 ENCOUNTER — Encounter: Payer: Self-pay | Admitting: Emergency Medicine

## 2022-02-10 ENCOUNTER — Ambulatory Visit
Admission: EM | Admit: 2022-02-10 | Discharge: 2022-02-10 | Disposition: A | Discharge status: Home / Self Care | Payer: Medicaid Other | Attending: Physician Assistant | Admitting: Physician Assistant

## 2022-02-10 DIAGNOSIS — J208 Acute bronchitis due to other specified organisms: Secondary | ICD-10-CM

## 2022-02-10 DIAGNOSIS — R059 Cough, unspecified: Secondary | ICD-10-CM

## 2022-02-10 DIAGNOSIS — R0989 Other specified symptoms and signs involving the circulatory and respiratory systems: Secondary | ICD-10-CM | POA: Diagnosis not present

## 2022-02-10 DIAGNOSIS — B9689 Other specified bacterial agents as the cause of diseases classified elsewhere: Secondary | ICD-10-CM | POA: Diagnosis not present

## 2022-02-10 DIAGNOSIS — R079 Chest pain, unspecified: Secondary | ICD-10-CM | POA: Diagnosis not present

## 2022-02-10 DIAGNOSIS — R0602 Shortness of breath: Secondary | ICD-10-CM

## 2022-02-10 MED ORDER — PREDNISONE 20 MG PO TABS: 20.0000 mg | ORAL_TABLET | Freq: Every day | ORAL | 0 refills | Status: AC

## 2022-02-10 MED ORDER — ALBUTEROL SULFATE HFA 108 (90 BASE) MCG/ACT IN AERS: 2.0000 | INHALATION_SPRAY | Freq: Four times a day (QID) | RESPIRATORY_TRACT | 0 refills | Status: DC | PRN

## 2022-02-10 MED ORDER — AZITHROMYCIN 250 MG PO TABS: ORAL_TABLET | ORAL | 0 refills | Status: DC

## 2022-02-10 NOTE — ED Provider Notes (Signed)
[facility] [place], [place]   Name: Dawn Taylor DOB: Jul 10, 1971 MRN: [id number] CSN: [id number] PCP: Jon Billings, NP  Arrival date and time:  02/10/22 1413  Chief Complaint:  Cough, Chest Pain, and Shortness of Breath   NOTE: Prior to seeing the patient today, I have reviewed the triage nursing documentation and vital signs. Clinical staff has updated patient's PMH/PSHx, current medication list, and drug allergies/intolerances to ensure comprehensive history available to assist in medical decision making.   History:   HPI: Dawn Taylor is a 51 y.o. female who presents today with complaints of worsening chest congestion, shortness of breath and wheezing over the past 2 weeks.  Patient was seen by primary care provider's office 1.5 weeks ago with similar symptoms and was treated symptomatically with ProAir and steroids.  Symptoms worsened since her first presentation.  She denies any fevers chills or body aches, though in the start of her symptoms she did wake up sweating.  Symptoms are worse at night.  No changes to her diet.  No recent antibiotics.  No known COVID exposure.   Past Medical History:  Diagnosis Date   Anxiety    Dizziness    Hypertension    Meniere disease     Past Surgical History:  Procedure Laterality Date   CESAREAN SECTION     TONSILLECTOMY      Family History  Problem Relation Age of Onset   Diabetes Mother    Hypertension Mother    Heart disease Father    Cancer Sister    Anxiety disorder Brother    Anxiety disorder Daughter    Hypertension Maternal Grandmother    Hypertension Maternal Grandfather    Colon cancer Paternal Grandfather     Social History   Tobacco Use   Smoking status: Never   Smokeless tobacco: Never  Vaping Use   Vaping Use: Never used  Substance Use Topics   Alcohol use: No   Drug use: No    Patient Active Problem List   Diagnosis Date Noted   Seasonal allergic rhinitis 01/31/2022   Wheezing  01/30/2022   Vitamin D deficiency 12/22/2020   Disorientation 12/21/2020   Hypercholesteremia 12/15/2020   Sinusitis 08/16/2020   Neck pain, acute 08/27/2019   Essential hypertension 02/14/2019   Anxiety    Meniere disease, left     Home Medications:    No outpatient medications have been marked as taking for the 02/10/22 encounter [facility] Encounter).    Allergies:   Ivp dye [iodinated contrast media], Benadryl [diphenhydramine], Lisinopril, and Metoprolol  Review of Systems (ROS): Review of Systems   Vital Signs: Today's Vitals   02/10/22 1430 02/10/22 1433  BP:  (!) 146/76  Pulse:  99  Resp:  16  Temp:  98 F (36.7 C)  TempSrc:  Oral  SpO2:  98%  Weight: 170 lb (77.1 kg)   Height: '5\' 2"'$  (1.575 m)   PainSc: 10-Worst pain ever     Physical Exam: Physical Exam Vitals and nursing note reviewed.  Constitutional:      Appearance: Normal appearance.  HENT:     Right Ear: A middle ear effusion is present. Tympanic membrane is not injected or erythematous.     Left Ear:  No middle ear effusion. Tympanic membrane is not injected or erythematous.     Nose: Nose normal.     Mouth/Throat:     Mouth: Mucous membranes are moist.     Pharynx: Posterior oropharyngeal erythema present.  Eyes:     Pupils: Pupils are equal, round, and reactive to light.  Cardiovascular:     Rate and Rhythm: Normal rate and regular rhythm.     Pulses: Normal pulses.     Heart sounds: Normal heart sounds.  Pulmonary:     Effort: No accessory muscle usage or respiratory distress.     Breath sounds: Decreased air movement present. Examination of the right-upper field reveals wheezing. Examination of the left-upper field reveals wheezing. Decreased breath sounds, wheezing and rales present.  Skin:    General: Skin is warm and dry.     Capillary Refill: Capillary refill takes less than 2 seconds.  Neurological:     Mental Status: She is alert.      Urgent Care Treatments / Results:    LABS: PLEASE NOTE: all labs that were ordered this encounter are listed, however only abnormal results are displayed. Labs Reviewed - No data to display  EKG: Normal sinus rhythm  RADIOLOGY: DG Chest 2 View  Result Date: 02/10/2022 CLINICAL DATA:  Cough and chest congestion for 2 weeks. Short of breath for 4-5 days. EXAM: CHEST - 2 VIEW COMPARISON:  11/23/2016. FINDINGS: Normal heart, mediastinum and hila. Clear lungs.  No pleural effusion.  No pneumothorax. Skeletal structures are unremarkable. IMPRESSION: No active cardiopulmonary disease. Electronically Signed   By: Lajean Manes M.D.   On: 02/10/2022 14:49    PROCEDURES: Procedures  MEDICATIONS RECEIVED THIS VISIT: Medications - No data to display  PERTINENT CLINICAL COURSE NOTES/UPDATES:   Initial Impression / Assessment and Plan / Urgent Care Course:  Pertinent labs & imaging results that were available during my care of the patient were personally reviewed by me and considered in my medical decision making (see lab/imaging section of note for values and interpretations).  Dawn Taylor is a 51 y.o. female who presents to [facility] today with complaints of cough and shortness of breath, diagnosed with bacterial bronchitis, and treated as such with the medications below. NP and patient reviewed discharge instructions below during visit.   Patient is well appearing overall in clinic today. She does not appear to be in any acute distress. Presenting symptoms (see HPI) and exam as documented above.   I have reviewed the follow up and strict return precautions for any new or worsening symptoms. Patient is aware of symptoms that would be deemed urgent/emergent, and would thus require further evaluation either here or in the emergency department. At the time of discharge, she verbalized understanding and consent with the discharge plan as it was reviewed with her. All questions were fielded by provider and/or clinic staff  prior to patient discharge.    Final Clinical Impressions / Urgent Care Diagnoses:   Final diagnoses:  Acute bacterial bronchitis    New Prescriptions:  [place] Controlled Substance Registry consulted? Not Applicable  Meds ordered this encounter  Medications   azithromycin (ZITHROMAX Z-PAK) 250 MG tablet    Sig: Take 500 mg (2 pills) on day 1, then 250 mg (1 pill) on days 2-4.    Dispense:  6 tablet    Refill:  0   predniSONE (DELTASONE) 20 MG tablet    Sig: Take 1 tablet (20 mg total) by mouth daily with breakfast for 5 days.    Dispense:  5 tablet    Refill:  0   albuterol (PROAIR HFA) 108 (90 Base) MCG/ACT inhaler    Sig: Inhale 2 puffs into the lungs every 6 (six) hours as needed  for wheezing or shortness of breath.    Dispense:  8.5 g    Refill:  0      Discharge Instructions      You were seen for wheezing and coughing and are being treated for bacterial bronchitis.   - Take the antibiotics as prescribed until they're finished. If you think you're having a reaction, stop the medication, take benadryl and go to the nearest urgent care/emergency room. Take a probiotic while taking the antibiotic to decrease the chances of stomach upset.    Take care, Dr. Marland Kitchen, NP-c      Recommended Follow up Care:  Patient encouraged to follow up with the following provider within the specified time frame, or sooner as dictated by the severity of her symptoms. As always, she was instructed that for any urgent/emergent care needs, she should seek care either here or in the emergency department for more immediate evaluation.   Gertie Baron, DNP, NP-c   Gertie Baron, NP 02/10/22 1506

## 2022-02-10 NOTE — Discharge Instructions (Signed)
You were seen for wheezing and coughing and are being treated for bacterial bronchitis.   - Take the antibiotics as prescribed until they're finished. If you think you're having a reaction, stop the medication, take benadryl and go to the nearest urgent care/emergency room. Take a probiotic while taking the antibiotic to decrease the chances of stomach upset.    Take care, Dr. Marland Kitchen, NP-c

## 2022-02-10 NOTE — ED Triage Notes (Signed)
Patient cough, chest congestion and SOB that started 2 weeks ago.  Patient states that she is concerned that she might have pneumonia.  Patient reports chest tight and pain in taking deep breaths that started 4-5 days ago.

## 2022-02-20 ENCOUNTER — Ambulatory Visit (INDEPENDENT_AMBULATORY_CARE_PROVIDER_SITE_OTHER): Payer: Medicaid Other | Admitting: Nurse Practitioner

## 2022-02-20 ENCOUNTER — Encounter: Payer: Self-pay | Admitting: Nurse Practitioner

## 2022-02-20 VITALS — BP 159/97 | HR 98 | Temp 98.7°F | Wt 187.2 lb

## 2022-02-20 DIAGNOSIS — E7800 Pure hypercholesterolemia, unspecified: Secondary | ICD-10-CM | POA: Diagnosis not present

## 2022-02-20 DIAGNOSIS — I10 Essential (primary) hypertension: Secondary | ICD-10-CM | POA: Diagnosis not present

## 2022-02-20 DIAGNOSIS — E559 Vitamin D deficiency, unspecified: Secondary | ICD-10-CM

## 2022-02-20 DIAGNOSIS — F419 Anxiety disorder, unspecified: Secondary | ICD-10-CM

## 2022-02-20 DIAGNOSIS — H6593 Unspecified nonsuppurative otitis media, bilateral: Secondary | ICD-10-CM | POA: Diagnosis not present

## 2022-02-20 MED ORDER — HYDROCHLOROTHIAZIDE 25 MG PO TABS
ORAL_TABLET | ORAL | 1 refills | Status: DC
Start: 2022-02-20 — End: 2023-02-24

## 2022-02-20 MED ORDER — METHYLPREDNISOLONE 4 MG PO TBPK: ORAL_TABLET | ORAL | 0 refills | Status: DC

## 2022-02-20 MED ORDER — AMLODIPINE BESYLATE 5 MG PO TABS: 5.0000 mg | ORAL_TABLET | Freq: Every day | ORAL | 1 refills | Status: DC

## 2022-02-20 MED ORDER — HYDRALAZINE HCL 10 MG PO TABS: 10.0000 mg | ORAL_TABLET | Freq: Two times a day (BID) | ORAL | 1 refills | Status: DC

## 2022-02-20 MED ORDER — PANTOPRAZOLE SODIUM 20 MG PO TBEC: DELAYED_RELEASE_TABLET | ORAL | 1 refills | Status: DC

## 2022-02-20 MED ORDER — ROSUVASTATIN CALCIUM 5 MG PO TABS: 5.0000 mg | ORAL_TABLET | Freq: Every day | ORAL | 1 refills | Status: DC

## 2022-02-20 MED ORDER — PAROXETINE HCL 10 MG PO TABS: ORAL_TABLET | ORAL | 1 refills | Status: DC

## 2022-02-20 NOTE — Progress Notes (Signed)
BP (!) 159/97   Pulse 98   Temp 98.7 F (37.1 C) (Oral)   Wt 187 lb 3.2 oz (84.9 kg)   LMP 02/03/2022 (Approximate)   SpO2 100%   BMI 34.24 kg/m    Subjective:    Patient ID: Dawn Taylor, female    DOB: 1971-01-19, 51 y.o.   MRN: [id number]  HPI: Dawn Taylor is a 51 y.o. female  Chief Complaint  Patient presents with   Hospitalization Follow-up    Follow up from ED. Patient states wheezing is better but notices it still night time and early in the mornings    Patient states she was seen in the ER for bronchitis.  The wheezing and rattling have improved but still a little rattling in the morning and evening.  States she is having some ear pain now.    Patient states she is feeling a lot better. Patient states she feels like the Paxil is working well for her. She has switched from drinking Dr. Malachi Bonds and is now drinking water only. States she feels like this is helping her anxiety and menieres.  As her anxiety is getting better she has decreased her use of lorazepam to every other day.    Patient states she is checking blood pressures at home and they are running 140/90s right now because she has been out of medication. When she was taking Hydralazine TID it was dropping her systolic into the 26V and she wasn't feeling well.  Has been taking hydralazine BID.   Denies HA, CP, SOB, dizziness, palpitations, visual changes, and lower extremity swelling.  Relevant past medical, surgical, family and social history reviewed and updated as indicated. Interim medical history since our last visit reviewed. Allergies and medications reviewed and updated.  Review of Systems  Constitutional:  Negative for fever and unexpected weight change.  HENT:  Positive for ear pain.   Eyes:  Negative for visual disturbance.  Respiratory:  Negative for cough, chest tightness, shortness of breath and wheezing.   Cardiovascular:  Negative for chest pain, palpitations and leg swelling.  Endocrine:  Negative for cold intolerance.  Neurological:  Negative for dizziness and headaches.    Per HPI unless specifically indicated above     Objective:    BP (!) 159/97   Pulse 98   Temp 98.7 F (37.1 C) (Oral)   Wt 187 lb 3.2 oz (84.9 kg)   LMP 02/03/2022 (Approximate)   SpO2 100%   BMI 34.24 kg/m   Wt Readings from Last 3 Encounters:  02/20/22 187 lb 3.2 oz (84.9 kg)  02/10/22 170 lb (77.1 kg)  01/30/22 187 lb 12.8 oz (85.2 kg)    Physical Exam Vitals and nursing note reviewed.  Constitutional:      General: She is not in acute distress.    Appearance: Normal appearance. She is normal weight. She is not ill-appearing, toxic-appearing or diaphoretic.  HENT:     Head: Normocephalic.     Right Ear: External ear normal. A middle ear effusion is present. Tympanic membrane is not erythematous.     Left Ear: External ear normal. A middle ear effusion is present. Tympanic membrane is not erythematous.     Nose: Nose normal.     Mouth/Throat:     Mouth: Mucous membranes are moist.     Pharynx: Oropharynx is clear.  Eyes:     General:        Right eye: No discharge.  Left eye: No discharge.     Extraocular Movements: Extraocular movements intact.     Conjunctiva/sclera: Conjunctivae normal.     Pupils: Pupils are equal, round, and reactive to light.  Cardiovascular:     Rate and Rhythm: Normal rate and regular rhythm.     Heart sounds: No murmur heard. Pulmonary:     Effort: Pulmonary effort is normal. No respiratory distress.     Breath sounds: Normal breath sounds. No wheezing or rales.  Musculoskeletal:     Cervical back: Normal range of motion and neck supple.  Skin:    General: Skin is warm and dry.     Capillary Refill: Capillary refill takes less than 2 seconds.  Neurological:     General: No focal deficit present.     Mental Status: She is alert and oriented to person, place, and time. Mental status is at baseline.  Psychiatric:        Mood and Affect: Mood  normal.        Behavior: Behavior normal.        Thought Content: Thought content normal.        Judgment: Judgment normal.     Results for orders placed or performed in visit on 12/21/20  Urine Culture   Specimen: Urine   UR  Result Value Ref Range   Urine Culture, Routine Final report    Organism ID, Bacteria Comment   Microscopic Examination   BLD  Result Value Ref Range   WBC, UA 0-5 0 - 5 /hpf   RBC, Urine 0-2 0 - 2 /hpf   Epithelial Cells (non renal) 0-10 0 - 10 /hpf   Mucus, UA Present (A) Not Estab.   Bacteria, UA Few (A) None seen/Few  [place] DCA Hb A1c Waived  Result Value Ref Range   HB A1C (BAYER DCA - WAIVED) 5.2 <7.0 %  CBC with Differential/Platelet  Result Value Ref Range   WBC 7.3 3.4 - 10.8 x10E3/uL   RBC 4.41 3.77 - 5.28 x10E6/uL   Hemoglobin 13.9 11.1 - 15.9 g/dL   Hematocrit 40.3 34.0 - 46.6 %   MCV 91 79 - 97 fL   MCH 31.5 26.6 - 33.0 pg   MCHC 34.5 31.5 - 35.7 g/dL   RDW 12.9 11.7 - 15.4 %   Platelets 307 150 - 450 x10E3/uL   Neutrophils 61 Not Estab. %   Lymphs 29 Not Estab. %   Monocytes 7 Not Estab. %   Eos 2 Not Estab. %   Basos 1 Not Estab. %   Neutrophils Absolute 4.5 1.4 - 7.0 x10E3/uL   Lymphocytes Absolute 2.1 0.7 - 3.1 x10E3/uL   Monocytes Absolute 0.5 0.1 - 0.9 x10E3/uL   EOS (ABSOLUTE) 0.2 0.0 - 0.4 x10E3/uL   Basophils Absolute 0.1 0.0 - 0.2 x10E3/uL   Immature Granulocytes 0 Not Estab. %   Immature Grans (Abs) 0.0 0.0 - 0.1 N02V2/ZD  Basic metabolic panel  Result Value Ref Range   Glucose 108 (H) 65 - 99 mg/dL   BUN 9 6 - 24 mg/dL   Creatinine, Ser 0.75 0.57 - 1.00 mg/dL   eGFR 97 >59 mL/min/1.73   BUN/Creatinine Ratio 12 9 - 23   Sodium 139 134 - 144 mmol/L   Potassium 3.4 (L) 3.5 - 5.2 mmol/L   Chloride 98 96 - 106 mmol/L   CO2 23 20 - 29 mmol/L   Calcium 9.8 8.7 - 10.2 mg/dL  TSH  Result Value Ref Range  TSH 2.110 0.450 - 4.500 uIU/mL  VITAMIN D 25 Hydroxy (Vit-D Deficiency, Fractures)  Result Value Ref Range    Vit D, 25-Hydroxy 10.9 (L) 30.0 - 100.0 ng/mL  Urinalysis, Routine w reflex microscopic  Result Value Ref Range   Specific Gravity, UA 1.015 1.005 - 1.030   pH, UA 6.0 5.0 - 7.5   Color, UA Yellow Yellow   Appearance Ur Cloudy (A) Clear   Leukocytes,UA 1+ (A) Negative   Protein,UA Negative Negative/Trace   Glucose, UA Negative Negative   Ketones, UA Negative Negative   RBC, UA 1+ (A) Negative   Bilirubin, UA Negative Negative   Urobilinogen, Ur 0.2 0.2 - 1.0 mg/dL   Nitrite, UA Negative Negative   Microscopic Examination See below:       Assessment & Plan:   Problem List Items Addressed This Visit   None Visit Diagnoses     Fluid level behind tympanic membrane of both ears    -  Primary   Ongoing. Recommend Zyrtec D. Will also treat with medrol dose pak to improve symptoms. FU if symptoms do not improve.        Follow up plan: No follow-ups on file.

## 2022-02-21 LAB — CBC WITH DIFFERENTIAL/PLATELET
Basophils Absolute: 0.1 10*3/uL (ref 0.0–0.2)
Basos: 1 %
EOS (ABSOLUTE): 0.2 10*3/uL (ref 0.0–0.4)
Eos: 2 %
Hematocrit: 42.4 % (ref 34.0–46.6)
Hemoglobin: 14.1 g/dL (ref 11.1–15.9)
Immature Grans (Abs): 0 10*3/uL (ref 0.0–0.1)
Immature Granulocytes: 0 %
Lymphocytes Absolute: 2.2 10*3/uL (ref 0.7–3.1)
Lymphs: 24 %
MCH: 30.6 pg (ref 26.6–33.0)
MCHC: 33.3 g/dL (ref 31.5–35.7)
MCV: 92 fL (ref 79–97)
Monocytes Absolute: 0.6 10*3/uL (ref 0.1–0.9)
Monocytes: 7 %
Neutrophils Absolute: 6 10*3/uL (ref 1.4–7.0)
Neutrophils: 66 %
Platelets: 325 10*3/uL (ref 150–450)
RBC: 4.61 x10E6/uL (ref 3.77–5.28)
RDW: 13.2 % (ref 11.7–15.4)
WBC: 9.1 10*3/uL (ref 3.4–10.8)

## 2022-02-21 LAB — COMPREHENSIVE METABOLIC PANEL
ALT: 11 IU/L (ref 0–32)
AST: 12 IU/L (ref 0–40)
Albumin/Globulin Ratio: 1.5 (ref 1.2–2.2)
Albumin: 4.4 g/dL (ref 3.8–4.9)
Alkaline Phosphatase: 55 IU/L (ref 44–121)
BUN/Creatinine Ratio: 16 (ref 9–23)
BUN: 13 mg/dL (ref 6–24)
Bilirubin Total: 0.5 mg/dL (ref 0.0–1.2)
CO2: 21 mmol/L (ref 20–29)
Calcium: 9.5 mg/dL (ref 8.7–10.2)
Chloride: 104 mmol/L (ref 96–106)
Creatinine, Ser: 0.8 mg/dL (ref 0.57–1.00)
Globulin, Total: 3 g/dL (ref 1.5–4.5)
Glucose: 85 mg/dL (ref 70–99)
Potassium: 4.1 mmol/L (ref 3.5–5.2)
Sodium: 141 mmol/L (ref 134–144)
Total Protein: 7.4 g/dL (ref 6.0–8.5)
eGFR: 89 mL/min/{1.73_m2} (ref >59)

## 2022-02-21 LAB — VITAMIN D 25 HYDROXY (VIT D DEFICIENCY, FRACTURES): Vit D, 25-Hydroxy: 22.4 ng/mL — ABNORMAL LOW (ref 30.0–100.0)

## 2022-02-21 LAB — LIPID PANEL
Chol/HDL Ratio: 6.6 ratio — ABNORMAL HIGH (ref 0.0–4.4)
Cholesterol, Total: 283 mg/dL — ABNORMAL HIGH (ref 100–199)
HDL: 43 mg/dL (ref >39)
LDL Chol Calc (NIH): 178 mg/dL — ABNORMAL HIGH (ref 0–99)
Triglycerides: 318 mg/dL — ABNORMAL HIGH (ref 0–149)
VLDL Cholesterol Cal: 62 mg/dL — ABNORMAL HIGH (ref 5–40)

## 2022-02-21 MED ORDER — VITAMIN D (ERGOCALCIFEROL) 1.25 MG (50000 UNIT) PO CAPS
50000.0000 [IU] | ORAL_CAPSULE | ORAL | 1 refills | Status: DC
Start: 2022-02-21 — End: 2022-05-06

## 2022-02-21 NOTE — Addendum Note (Signed)
Addended by: Jon Billings on: 02/21/2022 08:08 AM   Modules accepted: Orders

## 2022-02-21 NOTE — Progress Notes (Signed)
Please let patient know that her lab results show that her vitamin D is still low.  I sent in a refill of the vitamin D prescription. Her cholesterol is elevated from prior.  I recommend a low fat diet and exercise. Otherwise, her lab work looks good.  No other concerns at this time.

## 2022-03-06 ENCOUNTER — Encounter: Payer: Self-pay | Admitting: Nurse Practitioner

## 2022-03-06 MED ORDER — AMOXICILLIN 500 MG PO CAPS: 500.0000 mg | ORAL_CAPSULE | Freq: Two times a day (BID) | ORAL | 0 refills | Status: AC

## 2022-03-23 ENCOUNTER — Other Ambulatory Visit: Payer: Self-pay | Admitting: Nurse Practitioner

## 2022-03-25 NOTE — Telephone Encounter (Signed)
prescribed at [place] [facility] by Gertie Baron NP 02/10/22  Requested Prescriptions  Refused Prescriptions Disp Refills  . VENTOLIN HFA 108 (90 Base) MCG/ACT inhaler [Pharmacy Med Name: VENTOLIN HFA INH W/DOS CTR 200PUFFS] 18 g     Sig: INHALE 2 PUFFS INTO THE LUNGS EVERY 6 HOURS AS NEEDED FOR WHEEZING OR SHORTNESS OF BREATH     Pulmonology:  Beta Agonists 2 Failed - 03/23/2022  3:12 AM      Failed - Last BP in normal range    BP Readings from Last 1 Encounters:  02/20/22 (!) 159/97         Passed - Last Heart Rate in normal range    Pulse Readings from Last 1 Encounters:  02/20/22 98         Passed - Valid encounter within last 12 months    Recent Outpatient Visits          1 month ago Fluid level behind tympanic membrane of both ears   [facility] Jon Billings, NP   1 month ago [place], MD   1 year ago Acute non-recurrent maxillary sinusitis   [facility] McElwee, Lauren A, NP   1 year ago Meniere disease, left   [facility] Jon Billings, NP   1 year ago [place] Jon Billings, NP

## 2022-04-28 ENCOUNTER — Other Ambulatory Visit: Payer: Self-pay | Admitting: Nurse Practitioner

## 2022-04-29 NOTE — Telephone Encounter (Signed)
Refilled 02/20/2022 #90 1 rf Requested Prescriptions  Pending Prescriptions Disp Refills  . pantoprazole (PROTONIX) 20 MG tablet [Pharmacy Med Name: PANTOPRAZOLE '20MG'$  TABLETS] 90 tablet 1    Sig: TAKE 1 TABLET(20 MG) BY MOUTH DAILY     Gastroenterology: Proton Pump Inhibitors Passed - 04/28/2022  8:38 AM      Passed - Valid encounter within last 12 months    Recent Outpatient Visits          2 months ago Fluid level behind tympanic membrane of both ears   [place], NP   2 months ago Wheezing   [facility] V[place], Avanti, MD   1 year ago Acute non-recurrent maxillary sinusitis   [facility] McElwee, Lauren A, NP   1 year ago Meniere disease, left   [facility] Jon Billings, NP   1 year ago [place] Jon Billings, NP

## 2022-05-05 ENCOUNTER — Other Ambulatory Visit: Payer: Self-pay | Admitting: Nurse Practitioner

## 2022-05-06 NOTE — Telephone Encounter (Signed)
Requested medications are due for refill today.  Too soon  Requested medications are on the active medications list.  yes  Last refill. 02/21/2022 #12 1 rf  Future visit scheduled.   no  Notes to clinic.  Refill not delegated.    Requested Prescriptions  Pending Prescriptions Disp Refills   Vitamin D, Ergocalciferol, (DRISDOL) 1.25 MG (50000 UNIT) CAPS capsule [Pharmacy Med Name: VITAMIN D2 50,000IU (ERGO) CAP RX] 12 capsule 1    Sig: TAKE 1 CAPSULE BY MOUTH EVERY 7 DAYS     Endocrinology:  Vitamins - Vitamin D Supplementation 2 Failed - 05/05/2022  3:12 AM      Failed - Manual Review: Route requests for 50,000 IU strength to the provider      Failed - Vitamin D in normal range and within 360 days    Vit D, 25-Hydroxy  Date Value Ref Range Status  02/20/2022 22.4 (L) 30.0 - 100.0 ng/mL Final    Comment:    Vitamin D deficiency has been defined by the Institute of Medicine and an Endocrine Society practice guideline as a level of serum 25-OH vitamin D less than 20 ng/mL (1,2). The Endocrine Society went on to further define vitamin D insufficiency as a level between 21 and 29 ng/mL (2). 1. IOM (Institute of Medicine). 2010. Dietary reference    intakes for calcium and D. [place]: The    [employer]. 2. Holick MF, Binkley [place], Bischoff-Ferrari HA, et al.    Evaluation, treatment, and prevention of vitamin D    deficiency: an Endocrine Society clinical practice    guideline. JCEM. 2011 Jul; 96(7):1911-30.          Passed - Ca in normal range and within 360 days    Calcium  Date Value Ref Range Status  02/20/2022 9.5 8.7 - 10.2 mg/dL Final         Passed - Valid encounter within last 12 months    Recent Outpatient Visits           2 months ago Fluid level behind tympanic membrane of both ears   [place], NP   3 months ago [place], MD   1 year ago Acute non-recurrent maxillary  sinusitis   [facility] McElwee, Lauren A, NP   1 year ago Meniere disease, left   [facility] Jon Billings, NP   1 year ago [place] Jon Billings, NP

## 2022-11-26 ENCOUNTER — Encounter: Payer: Self-pay | Admitting: Nurse Practitioner

## 2022-11-26 ENCOUNTER — Ambulatory Visit (INDEPENDENT_AMBULATORY_CARE_PROVIDER_SITE_OTHER): Payer: Medicaid Other | Admitting: Nurse Practitioner

## 2022-11-26 ENCOUNTER — Other Ambulatory Visit: Payer: Self-pay | Admitting: Nurse Practitioner

## 2022-11-26 VITALS — BP 158/95 | HR 93 | Temp 98.7°F | Wt 185.9 lb

## 2022-11-26 DIAGNOSIS — E559 Vitamin D deficiency, unspecified: Secondary | ICD-10-CM | POA: Diagnosis not present

## 2022-11-26 DIAGNOSIS — H8102 Meniere's disease, left ear: Secondary | ICD-10-CM | POA: Diagnosis not present

## 2022-11-26 DIAGNOSIS — N939 Abnormal uterine and vaginal bleeding, unspecified: Secondary | ICD-10-CM

## 2022-11-26 DIAGNOSIS — F419 Anxiety disorder, unspecified: Secondary | ICD-10-CM | POA: Diagnosis not present

## 2022-11-26 DIAGNOSIS — E7800 Pure hypercholesterolemia, unspecified: Secondary | ICD-10-CM

## 2022-11-26 DIAGNOSIS — I10 Essential (primary) hypertension: Secondary | ICD-10-CM | POA: Diagnosis not present

## 2022-11-26 MED ORDER — LORAZEPAM 0.5 MG PO TABS
0.5000 mg | ORAL_TABLET | Freq: Three times a day (TID) | ORAL | 0 refills | Status: DC | PRN
Start: 2022-11-26 — End: 2024-06-17

## 2022-11-26 NOTE — Assessment & Plan Note (Signed)
Labs ordered at visit today.  Will make recommendations based on lab results.   

## 2022-11-26 NOTE — Assessment & Plan Note (Signed)
Chronic. Improved with current medication regimen. Continue with Paxil and lorazepam PRN. Refills sent today.  Return to clinic in 6 months for reevaluation. Call sooner if concerns arise.

## 2022-11-26 NOTE — Assessment & Plan Note (Signed)
Chronic. Not well controlled.  Patient has not been consistently taking medications.  Encouraged patient to restart medications.  Follow up in 1 month for reevaluation.  Call sooner if concerns arise.

## 2022-11-26 NOTE — Progress Notes (Signed)
BP (!) 158/95 (BP Location: Right Arm, Cuff Size: Large)   Pulse 93   Temp 98.7 F (37.1 C) (Oral)   Wt 185 lb 14.4 oz (84.3 kg)   SpO2 97%   BMI 34.00 kg/m    Subjective:    Patient ID: Dawn Taylor, female    DOB: 06-23-1971, 52 y.o.   MRN: [id number]  HPI: Dawn Taylor is a 52 y.o. female  Chief Complaint  Patient presents with   Anxiety   Hyperlipidemia   Hypertension   Meniere Disease   DIZZINESS Patient is here for follow up on her Menieres.  She is taking the hydrochlorothiazide daily and it is helping but she is still having dizziness daily.  Not currently using the scolpamine patch or meclizine.   Duration: weeks Description of symptoms: room spinning Duration of episode: hours Dizziness frequency: recurrent Provoking factors:  laying down and moving. Aggravating factors:   movement Triggered by rolling over in bed: yes Triggered by bending over: yes Aggravated by head movement: yes Aggravated by exertion, coughing, loud noises: no Recent head injury: no Recent or current viral symptoms: no History of vasovagal episodes: no Nausea: yes Vomiting: no Tinnitus: yes Hearing loss: yes Aural fullness: no Headache: no Photophobia/phonophobia: no Unsteady gait: no Postural instability: no Diplopia, dysarthria, dysphagia or weakness: no Related to exertion: no Pallor: no Diaphoresis: no Dyspnea: no Chest pain: no  GERD Patient states her symptoms have resolved since starting the protonix.  Does not currently have any symptoms of GERD.  HYPERTENSION without Chronic Kidney Disease Hypertension status: uncontrolled  Satisfied with current treatment? no Duration of hypertension: years BP monitoring frequency:  not checking BP range:  BP medication side effects:  no Medication compliance: not compliant Previous BP meds:amlodipine Aspirin: no Recurrent headaches: yes Visual changes: no Palpitations: no Dyspnea: no Chest pain: no Lower  extremity edema: no Dizzy/lightheaded: yes  Patient states around the end of November and December she was having horrible periods.  She didn't have a period in January.  Then in April she had another bad period.  She has since been bleeding for 4 weeks.  It is not heavy bleeding.  Very light but see's it daily.     Relevant past medical, surgical, family and social history reviewed and updated as indicated. Interim medical history since our last visit reviewed. Allergies and medications reviewed and updated.  Review of Systems  Eyes:  Negative for visual disturbance.  Respiratory:  Negative for cough, chest tightness and shortness of breath.   Cardiovascular:  Negative for chest pain, palpitations and leg swelling.  Gastrointestinal:        Reflux.  Genitourinary:  Positive for vaginal bleeding. Negative for vaginal pain.  Neurological:  Positive for dizziness. Negative for headaches.    Per HPI unless specifically indicated above     Objective:    BP (!) 158/95 (BP Location: Right Arm, Cuff Size: Large)   Pulse 93   Temp 98.7 F (37.1 C) (Oral)   Wt 185 lb 14.4 oz (84.3 kg)   SpO2 97%   BMI 34.00 kg/m   Wt Readings from Last 3 Encounters:  11/26/22 185 lb 14.4 oz (84.3 kg)  02/20/22 187 lb 3.2 oz (84.9 kg)  02/10/22 170 lb (77.1 kg)    Physical Exam Vitals and nursing note reviewed.  Constitutional:      General: She is not in acute distress.    Appearance: Normal appearance. She is not ill-appearing, toxic-appearing or diaphoretic.  HENT:     Head: Normocephalic.     Right Ear: External ear normal. A middle ear effusion is present. Tympanic membrane is not erythematous.     Left Ear: External ear normal. A middle ear effusion is present. Tympanic membrane is not erythematous.     Nose: Nose normal.     Mouth/Throat:     Mouth: Mucous membranes are moist.     Pharynx: Oropharynx is clear.  Eyes:     General:        Right eye: No discharge.        Left eye: No  discharge.     Extraocular Movements: Extraocular movements intact.     Conjunctiva/sclera: Conjunctivae normal.     Pupils: Pupils are equal, round, and reactive to light.  Cardiovascular:     Rate and Rhythm: Normal rate and regular rhythm.     Heart sounds: No murmur heard. Pulmonary:     Effort: Pulmonary effort is normal. No respiratory distress.     Breath sounds: Normal breath sounds. No wheezing or rales.  Musculoskeletal:     Cervical back: Normal range of motion and neck supple.  Skin:    General: Skin is warm and dry.     Capillary Refill: Capillary refill takes less than 2 seconds.  Neurological:     General: No focal deficit present.     Mental Status: She is alert and oriented to person, place, and time. Mental status is at baseline.  Psychiatric:        Mood and Affect: Mood normal.        Behavior: Behavior normal.        Thought Content: Thought content normal.        Judgment: Judgment normal.     Results for orders placed or performed in visit on 02/20/22  Comp Met (CMET)  Result Value Ref Range   Glucose 85 70 - 99 mg/dL   BUN 13 6 - 24 mg/dL   Creatinine, Ser 1.61 0.57 - 1.00 mg/dL   eGFR 89 >09 UE/AVW/0.98   BUN/Creatinine Ratio 16 9 - 23   Sodium 141 134 - 144 mmol/L   Potassium 4.1 3.5 - 5.2 mmol/L   Chloride 104 96 - 106 mmol/L   CO2 21 20 - 29 mmol/L   Calcium 9.5 8.7 - 10.2 mg/dL   Total Protein 7.4 6.0 - 8.5 g/dL   Albumin 4.4 3.8 - 4.9 g/dL   Globulin, Total 3.0 1.5 - 4.5 g/dL   Albumin/Globulin Ratio 1.5 1.2 - 2.2   Bilirubin Total 0.5 0.0 - 1.2 mg/dL   Alkaline Phosphatase 55 44 - 121 IU/L   AST 12 0 - 40 IU/L   ALT 11 0 - 32 IU/L  Vitamin D (25 hydroxy)  Result Value Ref Range   Vit D, 25-Hydroxy 22.4 (L) 30.0 - 100.0 ng/mL  CBC w/Diff  Result Value Ref Range   WBC 9.1 3.4 - 10.8 x10E3/uL   RBC 4.61 3.77 - 5.28 x10E6/uL   Hemoglobin 14.1 11.1 - 15.9 g/dL   Hematocrit 11.9 14.7 - 46.6 %   MCV 92 79 - 97 fL   MCH 30.6 26.6 - 33.0  pg   MCHC 33.3 31.5 - 35.7 g/dL   RDW 82.9 56.2 - 13.0 %   Platelets 325 150 - 450 x10E3/uL   Neutrophils 66 Not Estab. %   Lymphs 24 Not Estab. %   Monocytes 7 Not Estab. %   Eos 2 Not Estab. %  Basos 1 Not Estab. %   Neutrophils Absolute 6.0 1.4 - 7.0 x10E3/uL   Lymphocytes Absolute 2.2 0.7 - 3.1 x10E3/uL   Monocytes Absolute 0.6 0.1 - 0.9 x10E3/uL   EOS (ABSOLUTE) 0.2 0.0 - 0.4 x10E3/uL   Basophils Absolute 0.1 0.0 - 0.2 x10E3/uL   Immature Granulocytes 0 Not Estab. %   Immature Grans (Abs) 0.0 0.0 - 0.1 x10E3/uL  Lipid Profile  Result Value Ref Range   Cholesterol, Total 283 (H) 100 - 199 mg/dL   Triglycerides 161 (H) 0 - 149 mg/dL   HDL 43 >09 mg/dL   VLDL Cholesterol Cal 62 (H) 5 - 40 mg/dL   LDL Chol Calc (NIH) 604 (H) 0 - 99 mg/dL   Chol/HDL Ratio 6.6 (H) 0.0 - 4.4 ratio      Assessment & Plan:   Problem List Items Addressed This Visit       Cardiovascular and Mediastinum   Essential hypertension - Primary    Chronic. Not well controlled.  Patient has not been consistently taking medications.  Encouraged patient to restart medications.  Follow up in 1 month for reevaluation.  Call sooner if concerns arise.       Relevant Orders   Comp Met (CMET)     Nervous and Auditory   Meniere disease, left    Chronic. Ongoing.  Chronic.  Continue with current medication regimen. Encouraged patient to be consistent with medications.  Follow up in 1 month.  Call sooner if concerns arise.         Other   Anxiety    Chronic. Improved with current medication regimen. Continue with Paxil and lorazepam PRN. Refills sent today.  Return to clinic in 6 months for reevaluation. Call sooner if concerns arise.       Relevant Orders   CBC w/Diff   Hypercholesteremia    Labs ordered at visit today.  Will make recommendations based on lab results.        Relevant Orders   CBC w/Diff   Lipid Profile   Vitamin D deficiency    Labs ordered at visit today.  Will make  recommendations based on lab results.        Relevant Orders   Vitamin D (25 hydroxy)   Other Visit Diagnoses     Vaginal bleeding       Transvaginal US ordered today.  Patient needs updated PAP.  Hormone levels checked at visit today.   Relevant Orders   Estrogens, Total   FSH/LH   US Pelvic Complete With Transvaginal        Follow up plan: Return in about 1 month (around 12/26/2022) for BP Check.

## 2022-11-26 NOTE — Assessment & Plan Note (Signed)
Chronic. Ongoing.  Chronic.  Continue with current medication regimen. Encouraged patient to be consistent with medications.  Follow up in 1 month.  Call sooner if concerns arise.

## 2022-11-27 ENCOUNTER — Other Ambulatory Visit: Payer: Medicaid Other

## 2022-11-27 DIAGNOSIS — E559 Vitamin D deficiency, unspecified: Secondary | ICD-10-CM

## 2022-11-27 DIAGNOSIS — F419 Anxiety disorder, unspecified: Secondary | ICD-10-CM

## 2022-11-27 DIAGNOSIS — N858 Other specified noninflammatory disorders of uterus: Secondary | ICD-10-CM

## 2022-11-27 DIAGNOSIS — N939 Abnormal uterine and vaginal bleeding, unspecified: Secondary | ICD-10-CM

## 2022-11-27 DIAGNOSIS — I10 Essential (primary) hypertension: Secondary | ICD-10-CM

## 2022-11-27 DIAGNOSIS — E7800 Pure hypercholesterolemia, unspecified: Secondary | ICD-10-CM | POA: Diagnosis not present

## 2022-11-28 NOTE — Progress Notes (Signed)
Hi Dawn Taylor. It was good to see you this week.  Your lab work shows that your cholesterol is elevated.  I recommend a low fat diet and exercise.  Your vitamin D is just above normal so I would switch over to Vitamin D 2000 IU once daily now.    So far your hormones are normal.  I recommend getting the ultrasound.  I'll see you at our next visit.

## 2022-12-01 LAB — CBC WITH DIFFERENTIAL/PLATELET
Basophils Absolute: 0.1 10*3/uL (ref 0.0–0.2)
Basos: 1 %
EOS (ABSOLUTE): 0.1 10*3/uL (ref 0.0–0.4)
Eos: 1 %
Hematocrit: 38.8 % (ref 34.0–46.6)
Hemoglobin: 13.2 g/dL (ref 11.1–15.9)
Immature Grans (Abs): 0 10*3/uL (ref 0.0–0.1)
Immature Granulocytes: 0 %
Lymphocytes Absolute: 2 10*3/uL (ref 0.7–3.1)
Lymphs: 28 %
MCH: 30.8 pg (ref 26.6–33.0)
MCHC: 34 g/dL (ref 31.5–35.7)
MCV: 91 fL (ref 79–97)
Monocytes Absolute: 0.3 10*3/uL (ref 0.1–0.9)
Monocytes: 5 %
Neutrophils Absolute: 4.7 10*3/uL (ref 1.4–7.0)
Neutrophils: 65 %
Platelets: 309 10*3/uL (ref 150–450)
RBC: 4.28 x10E6/uL (ref 3.77–5.28)
RDW: 14.1 % (ref 11.7–15.4)
WBC: 7.2 10*3/uL (ref 3.4–10.8)

## 2022-12-01 LAB — FSH/LH
FSH: 8.7 m[IU]/mL
LH: 13.2 m[IU]/mL

## 2022-12-01 LAB — COMPREHENSIVE METABOLIC PANEL
ALT: 12 IU/L (ref 0–32)
AST: 18 IU/L (ref 0–40)
Albumin/Globulin Ratio: 1.4 (ref 1.2–2.2)
Albumin: 4.4 g/dL (ref 3.8–4.9)
Alkaline Phosphatase: 51 IU/L (ref 44–121)
BUN/Creatinine Ratio: 11 (ref 9–23)
BUN: 8 mg/dL (ref 6–24)
Bilirubin Total: 0.3 mg/dL (ref 0.0–1.2)
CO2: 18 mmol/L — ABNORMAL LOW (ref 20–29)
Calcium: 9.4 mg/dL (ref 8.7–10.2)
Chloride: 100 mmol/L (ref 96–106)
Creatinine, Ser: 0.71 mg/dL (ref 0.57–1.00)
Globulin, Total: 3.2 g/dL (ref 1.5–4.5)
Glucose: 87 mg/dL (ref 70–99)
Potassium: 4.4 mmol/L (ref 3.5–5.2)
Sodium: 138 mmol/L (ref 134–144)
Total Protein: 7.6 g/dL (ref 6.0–8.5)
eGFR: 102 mL/min/{1.73_m2} (ref >59)

## 2022-12-01 LAB — LIPID PANEL
Chol/HDL Ratio: 5.4 ratio — ABNORMAL HIGH (ref 0.0–4.4)
Cholesterol, Total: 231 mg/dL — ABNORMAL HIGH (ref 100–199)
HDL: 43 mg/dL (ref >39)
LDL Chol Calc (NIH): 121 mg/dL — ABNORMAL HIGH (ref 0–99)
Triglycerides: 379 mg/dL — ABNORMAL HIGH (ref 0–149)
VLDL Cholesterol Cal: 67 mg/dL — ABNORMAL HIGH (ref 5–40)

## 2022-12-01 LAB — VITAMIN D 25 HYDROXY (VIT D DEFICIENCY, FRACTURES): Vit D, 25-Hydroxy: 31.8 ng/mL (ref 30.0–100.0)

## 2022-12-01 LAB — ESTROGENS, TOTAL: Estrogen: 120 pg/mL

## 2022-12-02 ENCOUNTER — Ambulatory Visit
Admission: RE | Admit: 2022-12-02 | Discharge: 2022-12-02 | Disposition: A | Discharge status: Home / Self Care | Payer: Medicaid Other | Source: Ambulatory Visit | Attending: Nurse Practitioner | Admitting: Nurse Practitioner

## 2022-12-02 DIAGNOSIS — N939 Abnormal uterine and vaginal bleeding, unspecified: Secondary | ICD-10-CM | POA: Diagnosis not present

## 2022-12-03 ENCOUNTER — Encounter: Payer: Self-pay | Admitting: Nurse Practitioner

## 2022-12-03 NOTE — Progress Notes (Signed)
Please let patient know that her ultrasound shows that she does have two masses in her uterus which appear to be fibroids (benign collection of fibrous tissue).  However, the radiologist was not able to rule out other causes of vaginal masses.  I have placed an urgent referral for her to see GYN for further imaging and evaluation.

## 2022-12-04 ENCOUNTER — Ambulatory Visit (INDEPENDENT_AMBULATORY_CARE_PROVIDER_SITE_OTHER): Payer: Medicaid Other | Admitting: Obstetrics and Gynecology

## 2022-12-04 ENCOUNTER — Encounter: Payer: Self-pay | Admitting: Obstetrics and Gynecology

## 2022-12-04 VITALS — BP 177/93 | HR 114 | Ht 62.0 in | Wt 183.3 lb

## 2022-12-04 DIAGNOSIS — Z7689 Persons encountering health services in other specified circumstances: Secondary | ICD-10-CM | POA: Diagnosis not present

## 2022-12-04 DIAGNOSIS — N858 Other specified noninflammatory disorders of uterus: Secondary | ICD-10-CM

## 2022-12-04 NOTE — Progress Notes (Signed)
Patient presents today to discuss recent ultrasound. She states after skipping 2-3 monthly menstrual cycles she began to bleed for the month of April. She reports the bleeding in the beginning was heavy and then became daily spotting. She denies pain and states no history of fibroids to her knowledge. Patient does states taking her daughters OCP's for 2-3 weeks to try and stop the bleeding. Recent FSH is 8.7. Ultrasound preformed on 12/02/22.

## 2022-12-04 NOTE — Progress Notes (Signed)
HPI:      [name] is a 52 y.o. [id number] who LMP was Patient's last menstrual period was 11/02/2022.  Subjective:   She presents today because her menstrual periods have changed recently.  She was having normal regular cycles without significant dysmenorrhea or heavy bleeding until December.  After December she missed 1 or 2 menses and then began bleeding like a normal menstrual period.  2 regulate her cycle and she took some of her daughters OCPs for 3 weeks.  She stopped them and then have another irregular menstrual period.  During this time she saw her primary care provider who performed an ultrasound which showed uterine masses likely consistent with fibroids.  An FSH was also obtained but likely was affected by OCPs. She is not currently bleeding.    Hx: The following portions of the patient's history were reviewed and updated as appropriate:             She  has a past medical history of Anxiety, Dizziness, Hypertension, and Meniere disease. She does not have any pertinent problems on file. She  has a past surgical history that includes Cesarean section and Tonsillectomy. Her family history includes Anxiety disorder in her brother and daughter; Cancer in her sister; Colon cancer in her paternal grandfather; Diabetes in her mother; Heart disease in her father; Hypertension in her maternal grandfather, maternal grandmother, and mother. She  reports that she has never smoked. She has never used smokeless tobacco. She reports that she does not drink alcohol and does not use drugs. She has a current medication list which includes the following prescription(s): albuterol, amlodipine, epinephrine, fexofenadine, fluticasone, hydralazine, hydrochlorothiazide, lorazepam, meclizine, pantoprazole, paroxetine, rosuvastatin, scopolamine, and vitamin d (ergocalciferol). She is allergic to ivp dye [iodinated contrast media], benadryl [diphenhydramine], lisinopril, and metoprolol.       Review of  Systems:  Review of Systems  Constitutional: Denied constitutional symptoms, night sweats, recent illness, fatigue, fever, insomnia and weight loss.  Eyes: Denied eye symptoms, eye pain, photophobia, vision change and visual disturbance.  Ears/Nose/Throat/Neck: Denied ear, nose, throat or neck symptoms, hearing loss, nasal discharge, sinus congestion and sore throat.  Cardiovascular: Denied cardiovascular symptoms, arrhythmia, chest pain/pressure, edema, exercise intolerance, orthopnea and palpitations.  Respiratory: Denied pulmonary symptoms, asthma, pleuritic pain, productive sputum, cough, dyspnea and wheezing.  Gastrointestinal: Denied, gastro-esophageal reflux, melena, nausea and vomiting.  Genitourinary: See HPI for additional information.  Musculoskeletal: Denied musculoskeletal symptoms, stiffness, swelling, muscle weakness and myalgia.  Dermatologic: Denied dermatology symptoms, rash and scar.  Neurologic: Denied neurology symptoms, dizziness, headache, neck pain and syncope.  Psychiatric: Denied psychiatric symptoms, anxiety and depression.  Endocrine: Denied endocrine symptoms including hot flashes and night sweats.   Meds:   Current Outpatient Medications on File Prior to Visit  Medication Sig Dispense Refill   albuterol (PROAIR HFA) 108 (90 Base) MCG/ACT inhaler Inhale 2 puffs into the lungs every 6 (six) hours as needed for wheezing or shortness of breath. 8.5 g 0   amLODipine (NORVASC) 5 MG tablet Take 1 tablet (5 mg total) by mouth daily. 90 tablet 1   EPINEPHrine (EPIPEN 2-PAK) 0.3 mg/0.3 mL IJ SOAJ injection Inject 0.3 mLs (0.3 mg total) into the muscle as needed for anaphylaxis. 1 each 0   fexofenadine (ALLEGRA ALLERGY) 180 MG tablet Take 1 tablet (180 mg total) by mouth daily. 10 tablet 1   fluticasone (FLONASE) 50 MCG/ACT nasal spray SHAKE LIQUID AND USE 2 SPRAYS IN EACH NOSTRIL TWICE DAILY 16 g 6  hydrALAZINE (APRESOLINE) 10 MG tablet Take 1 tablet (10 mg total) by  mouth 2 (two) times daily. 180 tablet 1   hydrochlorothiazide (HYDRODIURIL) 25 MG tablet TAKE 1 TABLET(25 MG) BY MOUTH DAILY 90 tablet 1   LORazepam (ATIVAN) 0.5 MG tablet Take 1 tablet (0.5 mg total) by mouth every 8 (eight) hours as needed for anxiety. 30 tablet 0   meclizine (ANTIVERT) 25 MG tablet Take 1 tablet (25 mg total) by mouth 3 (three) times daily as needed for dizziness. 30 tablet 0   pantoprazole (PROTONIX) 20 MG tablet TAKE 1 TABLET(20 MG) BY MOUTH DAILY 90 tablet 1   PARoxetine (PAXIL) 10 MG tablet TAKE 1 TABLET(10 MG) BY MOUTH DAILY 90 tablet 1   rosuvastatin (CRESTOR) 5 MG tablet Take 1 tablet (5 mg total) by mouth daily. 90 tablet 1   scopolamine (TRANSDERM-SCOP, 1.5 MG,) 1 MG/3DAYS Place 1 patch (1.5 mg total) onto the skin every 3 (three) days. 10 patch 1   Vitamin D, Ergocalciferol, (DRISDOL) 1.25 MG (50000 UNIT) CAPS capsule TAKE 1 CAPSULE BY MOUTH EVERY 7 DAYS 12 capsule 1   No current facility-administered medications on file prior to visit.      Objective:     Vitals:   12/04/22 1321 12/04/22 1328  BP: (!) 167/107 (!) 177/93  Pulse: (!) 114    Filed Weights   12/04/22 1321  Weight: 183 lb 4.8 oz (83.1 kg)              Ultrasound results reviewed directly with the patient          Assessment:    [id number] Patient Active Problem List   Diagnosis Date Noted   Seasonal allergic rhinitis 01/31/2022   Wheezing 01/30/2022   Vitamin D deficiency 12/22/2020   Disorientation 12/21/2020   Hypercholesteremia 12/15/2020   Sinusitis 08/16/2020   Neck pain, acute 08/27/2019   Essential hypertension 02/14/2019   Anxiety    Meniere disease, left      1. Establishing care with new doctor, encounter for   2. Uterine mass     Uterine masses likely consistent with fibroids.  Risk of endometrial cancer low based on patient's history and appearance of uterus.  Menopause possible as patient was skipping menses until she began taking OCPs   Plan:            1.   Recommend expectant management at this point.  If she has limited or no menstrual periods and FSH should be ordered.  Should she bleed irregularly or heavily strongly consider endometrial biopsy and possible follow-up ultrasound.  Should she have normal regular cycles without issue she will obviously not be menopausal and will not need any further workup at this time.  I have asked her to check back in with me in August or sooner if she has above symptoms. Orders No orders of the defined types were placed in this encounter.   No orders of the defined types were placed in this encounter.     F/U  Return in about 3 months (around 03/06/2023). I spent 31 minutes involved in the care of this patient preparing to see the patient by obtaining and reviewing her medical history (including labs, imaging tests and prior procedures), documenting clinical information in the electronic health record (EHR), counseling and coordinating care plans, writing and sending prescriptions, ordering tests or procedures and in direct communicating with the patient and medical staff discussing pertinent items from her history and physical exam.  Elmon Else.  Logan Bores, M.D. 12/04/2022 1:59 PM

## 2022-12-26 ENCOUNTER — Ambulatory Visit (INDEPENDENT_AMBULATORY_CARE_PROVIDER_SITE_OTHER): Payer: Medicaid Other | Admitting: Nurse Practitioner

## 2022-12-26 VITALS — BP 155/95 | HR 96 | Temp 98.2°F | Wt 185.2 lb

## 2022-12-26 DIAGNOSIS — I10 Essential (primary) hypertension: Secondary | ICD-10-CM

## 2022-12-26 DIAGNOSIS — N939 Abnormal uterine and vaginal bleeding, unspecified: Secondary | ICD-10-CM | POA: Diagnosis not present

## 2022-12-26 MED ORDER — VALSARTAN 80 MG PO TABS: 80.0000 mg | ORAL_TABLET | Freq: Every day | ORAL | 0 refills | Status: DC

## 2022-12-26 NOTE — Assessment & Plan Note (Signed)
Chronic. Not well controlled.  States she feels like she is swelling with the amlodipine.  Tolerating the HCTZ well.  Will stop amlodipine and start Valsartan 80mg  daily.  Side effects and benefits discussed during visit.  Follow up in 2 months.  Call sooner if concerns arise.

## 2022-12-26 NOTE — Progress Notes (Signed)
BP (!) 155/95   Pulse 96   Temp 98.2 F (36.8 C) (Oral)   Wt 185 lb 3.2 oz (84 kg)   LMP 11/02/2022   SpO2 98%   BMI 33.87 kg/m    Subjective:    Patient ID: Rubie Maid, female    DOB: 1971/07/19, 52 y.o.   MRN: [id number]  HPI: HEATHER LIKES is a 52 y.o. female  Chief Complaint  Patient presents with   Hypertension   HYPERTENSION without Chronic Kidney Disease Hypertension status: uncontrolled  Satisfied with current treatment? no Duration of hypertension: years BP monitoring frequency:  not checking BP range:  BP medication side effects:  no Medication compliance: not compliant Previous BP meds:amlodipine and HCTZ Aspirin: no Recurrent headaches: yes Visual changes: no Palpitations: no Dyspnea: no Chest pain: no Lower extremity edema: no Dizzy/lightheaded: yes    Relevant past medical, surgical, family and social history reviewed and updated as indicated. Interim medical history since our last visit reviewed. Allergies and medications reviewed and updated.  Review of Systems  Eyes:  Negative for visual disturbance.  Respiratory:  Negative for cough, chest tightness and shortness of breath.   Cardiovascular:  Negative for chest pain, palpitations and leg swelling.  Neurological:  Positive for dizziness and headaches.    Per HPI unless specifically indicated above     Objective:    BP (!) 155/95   Pulse 96   Temp 98.2 F (36.8 C) (Oral)   Wt 185 lb 3.2 oz (84 kg)   LMP 11/02/2022   SpO2 98%   BMI 33.87 kg/m   Wt Readings from Last 3 Encounters:  12/26/22 185 lb 3.2 oz (84 kg)  12/04/22 183 lb 4.8 oz (83.1 kg)  11/26/22 185 lb 14.4 oz (84.3 kg)    Physical Exam Vitals and nursing note reviewed.  Constitutional:      General: She is not in acute distress.    Appearance: Normal appearance. She is not ill-appearing, toxic-appearing or diaphoretic.  HENT:     Head: Normocephalic.     Right Ear: External ear normal. A middle ear  effusion is present. Tympanic membrane is not erythematous.     Left Ear: External ear normal. A middle ear effusion is present. Tympanic membrane is not erythematous.     Nose: Nose normal.     Mouth/Throat:     Mouth: Mucous membranes are moist.     Pharynx: Oropharynx is clear.  Eyes:     General:        Right eye: No discharge.        Left eye: No discharge.     Extraocular Movements: Extraocular movements intact.     Conjunctiva/sclera: Conjunctivae normal.     Pupils: Pupils are equal, round, and reactive to light.  Cardiovascular:     Rate and Rhythm: Normal rate and regular rhythm.     Heart sounds: No murmur heard. Pulmonary:     Effort: Pulmonary effort is normal. No respiratory distress.     Breath sounds: Normal breath sounds. No wheezing or rales.  Musculoskeletal:     Cervical back: Normal range of motion and neck supple.  Skin:    General: Skin is warm and dry.     Capillary Refill: Capillary refill takes less than 2 seconds.  Neurological:     General: No focal deficit present.     Mental Status: She is alert and oriented to person, place, and time. Mental status is at baseline.  Psychiatric:        Mood and Affect: Mood normal.        Behavior: Behavior normal.        Thought Content: Thought content normal.        Judgment: Judgment normal.     Results for orders placed or performed in visit on 11/27/22  Vitamin D (25 hydroxy)  Result Value Ref Range   Vit D, 25-Hydroxy 31.8 30.0 - 100.0 ng/mL  Lipid Profile  Result Value Ref Range   Cholesterol, Total 231 (H) 100 - 199 mg/dL   Triglycerides 161 (H) 0 - 149 mg/dL   HDL 43 >09 mg/dL   VLDL Cholesterol Cal 67 (H) 5 - 40 mg/dL   LDL Chol Calc (NIH) 604 (H) 0 - 99 mg/dL   Chol/HDL Ratio 5.4 (H) 0.0 - 4.4 ratio  FSH/LH  Result Value Ref Range   LH 13.2 mIU/mL   FSH 8.7 mIU/mL  Estrogens, Total  Result Value Ref Range   Estrogen 120 pg/mL  Comp Met (CMET)  Result Value Ref Range   Glucose 87 70 - 99  mg/dL   BUN 8 6 - 24 mg/dL   Creatinine, Ser 5.40 0.57 - 1.00 mg/dL   eGFR 981 >19 JY/NWG/9.56   BUN/Creatinine Ratio 11 9 - 23   Sodium 138 134 - 144 mmol/L   Potassium 4.4 3.5 - 5.2 mmol/L   Chloride 100 96 - 106 mmol/L   CO2 18 (L) 20 - 29 mmol/L   Calcium 9.4 8.7 - 10.2 mg/dL   Total Protein 7.6 6.0 - 8.5 g/dL   Albumin 4.4 3.8 - 4.9 g/dL   Globulin, Total 3.2 1.5 - 4.5 g/dL   Albumin/Globulin Ratio 1.4 1.2 - 2.2   Bilirubin Total 0.3 0.0 - 1.2 mg/dL   Alkaline Phosphatase 51 44 - 121 IU/L   AST 18 0 - 40 IU/L   ALT 12 0 - 32 IU/L  CBC w/Diff  Result Value Ref Range   WBC 7.2 3.4 - 10.8 x10E3/uL   RBC 4.28 3.77 - 5.28 x10E6/uL   Hemoglobin 13.2 11.1 - 15.9 g/dL   Hematocrit 21.3 08.6 - 46.6 %   MCV 91 79 - 97 fL   MCH 30.8 26.6 - 33.0 pg   MCHC 34.0 31.5 - 35.7 g/dL   RDW 57.8 46.9 - 62.9 %   Platelets 309 150 - 450 x10E3/uL   Neutrophils 65 Not Estab. %   Lymphs 28 Not Estab. %   Monocytes 5 Not Estab. %   Eos 1 Not Estab. %   Basos 1 Not Estab. %   Neutrophils Absolute 4.7 1.4 - 7.0 x10E3/uL   Lymphocytes Absolute 2.0 0.7 - 3.1 x10E3/uL   Monocytes Absolute 0.3 0.1 - 0.9 x10E3/uL   EOS (ABSOLUTE) 0.1 0.0 - 0.4 x10E3/uL   Basophils Absolute 0.1 0.0 - 0.2 x10E3/uL   Immature Granulocytes 0 Not Estab. %   Immature Grans (Abs) 0.0 0.0 - 0.1 x10E3/uL      Assessment & Plan:   Problem List Items Addressed This Visit       Cardiovascular and Mediastinum   Essential hypertension - Primary    Chronic. Not well controlled.  States she feels like she is swelling with the amlodipine.  Tolerating the HCTZ well.  Will stop amlodipine and start Valsartan 80mg  daily.  Side effects and benefits discussed during visit.  Follow up in 2 months.  Call sooner if concerns arise.  Relevant Medications   valsartan (DIOVAN) 80 MG tablet   Other Visit Diagnoses     Vaginal bleeding       Has seen GYN.  Will recheck hormones in 2 weeks.  Labs ordered today.   Relevant  Orders   FSH/LH   Estrogens, Total        Follow up plan: Return in about 2 months (around 02/25/2023) for BP Check.

## 2023-01-03 ENCOUNTER — Other Ambulatory Visit: Payer: Medicaid Other

## 2023-01-03 DIAGNOSIS — N939 Abnormal uterine and vaginal bleeding, unspecified: Secondary | ICD-10-CM

## 2023-01-06 ENCOUNTER — Ambulatory Visit: Payer: Medicaid Other | Admitting: Nurse Practitioner

## 2023-01-06 NOTE — Progress Notes (Deleted)
   There were no vitals taken for this visit.   Subjective:    Patient ID: Dawn Taylor, female    DOB: 06/29/1971, 52 y.o.   MRN: [id number]  HPI: Dawn Taylor is a 52 y.o. female  No chief complaint on file.  ABDOMINAL ISSUES Duration: {Blank single:19197::"chronic","days","weeks","months"} Nature: {Blank multiple:19196::"bloating","sharp","dull","aching","burning","cramping","ill-defined","itchy","pressure-like","pulling","shooting","sore","stabbing","tender","tearing","throbbing"} Location: {Blank multiple:19196::"diffuse","vague","LUQ","RUQ","epigastric","peri-umbilical","LLQ","RLQ","diffuse","suprapubic". "lower abdominal quadrants"}  Severity: {Blank single:19197::"mild","moderate","severe","1/10","2/10","3/10","4/10","5/10","6/10","7/10","8/10","9/10","10/10"}  Radiation: {Blank single:19197::"yes","no"} Episode duration: Frequency: {Blank single:19197::"constant","intermittent","occasional","rare","every few minutes","a few times a hour","a few times a day","a few times a week","a few times a month","a few times a year"} Alleviating factors: Aggravating factors: Treatments attempted: {Blank multiple:19196::"none","antacids","PPI","H2 Blocker","laxatives"} Constipation: {Blank single:19197::"yes","no","intermittent"} Diarrhea: {Blank single:19197::"yes","no"} Episodes of diarrhea/day: Mucous in the stool: {Blank single:19197::"yes","no"} Heartburn: {Blank single:19197::"yes","no"} Bloating:{Blank single:19197::"yes","no"} Flatulence: {Blank single:19197::"yes","no"} Nausea: {Blank single:19197::"yes","no"} Vomiting: {Blank single:19197::"yes","no"} Episodes of vomit/day: Melena or hematochezia: {Blank single:19197::"yes","no"} Rash: {Blank single:19197::"yes","no"} Jaundice: {Blank single:19197::"yes","no"} Fever: {Blank single:19197::"yes","no"} Weight loss: {Blank single:19197::"yes","no"}  Relevant past medical, surgical, family and social history reviewed and  updated as indicated. Interim medical history since our last visit reviewed. Allergies and medications reviewed and updated.  Review of Systems  Per HPI unless specifically indicated above     Objective:    There were no vitals taken for this visit.  Wt Readings from Last 3 Encounters:  12/26/22 185 lb 3.2 oz (84 kg)  12/04/22 183 lb 4.8 oz (83.1 kg)  11/26/22 185 lb 14.4 oz (84.3 kg)    Physical Exam  Results for orders placed or performed in visit on 01/03/23  Estrogens, Total  Result Value Ref Range   Estrogen WILL FOLLOW   FSH/LH  Result Value Ref Range   LH 33.9 mIU/mL   FSH 6.5 mIU/mL      Assessment & Plan:   Problem List Items Addressed This Visit   None    Follow up plan: No follow-ups on file.

## 2023-01-10 ENCOUNTER — Telehealth: Payer: Self-pay

## 2023-01-10 LAB — FSH/LH
FSH: 6.5 m[IU]/mL
LH: 33.9 m[IU]/mL

## 2023-01-10 LAB — ESTROGENS, TOTAL: Estrogen: 1317 pg/mL

## 2023-01-10 NOTE — Progress Notes (Signed)
Hi Dawn Taylor.  Your hormone levels came back and are higher than on the last check.  I recommend following up with GYN.

## 2023-01-10 NOTE — Telephone Encounter (Signed)
Pt saw her PCP and hormone levels were checked and came back high; PCP adv pt to contact her GYN as soon as possible.  Pt states her estrogen level is 1,317 and in April it was 120; FSH 6.5 and LH is 33.9 and was 13.2 in April.  Pt also wanted to let DJE know that she has had no bleeding or cramping or nothing like that.  [phone]

## 2023-01-20 ENCOUNTER — Encounter: Payer: Self-pay | Admitting: Nurse Practitioner

## 2023-01-20 NOTE — Telephone Encounter (Signed)
Called patient to state if she would like to follow-up, she needs an appointment. Unable to lvm

## 2023-01-21 ENCOUNTER — Encounter: Payer: Self-pay | Admitting: Family Medicine

## 2023-01-21 ENCOUNTER — Telehealth (INDEPENDENT_AMBULATORY_CARE_PROVIDER_SITE_OTHER): Payer: Medicaid Other | Admitting: Family Medicine

## 2023-01-21 VITALS — BP 122/90 | HR 127 | Temp 99.5°F

## 2023-01-21 DIAGNOSIS — J0100 Acute maxillary sinusitis, unspecified: Secondary | ICD-10-CM | POA: Diagnosis not present

## 2023-01-21 MED ORDER — AMOXICILLIN-POT CLAVULANATE 875-125 MG PO TABS: 1.0000 | ORAL_TABLET | Freq: Two times a day (BID) | ORAL | 0 refills | Status: DC

## 2023-01-21 NOTE — Progress Notes (Signed)
BP (!) 122/90 Comment: Taken by patient  Pulse (!) 127 Comment: Taken by patient  Temp 99.5 F (37.5 C) Comment: Taken by patient   Subjective:    Patient ID: Dawn Taylor, female    DOB: November 03, 1970, 53 y.o.   MRN: [id number]  This visit was completed via video. All issues as above were discussed and addressed but no physical exam was performed. If it was felt that the patient should be evaluated in the office, they were directed there. The patient verbally consented to this visit. This visit was completed via video visit through MyChart due to the restrictions of the COVID-19 pandemic. All issues as above were discussed and addressed. Physical exam was done as above through visual confirmation on video through MyChart. If it was felt that the patient should be evaluated in the office, they were directed there. The patient verbally consented to this visit. Location of the patient: home Location of the provider: work Those involved with this call:  Provider:  Prescott Gum, NP CMA: Tristan Schroeder, CMA Front Desk/Registration: Kandice Hams  Time spent on call:  10 minutes with patient face to face via video conference. More than 50% of this time was spent in counseling and coordination of care. 10 minutes total spent in review of patient's record and preparation of their chart.  HPI: Dawn Taylor is a 52 y.o. female  Chief Complaint  Patient presents with   sinus pressure    Started 2 days ago, having pressure behind eyes, fever and facial pain, has taken OTC meds which has not help   SINUSITIS She has history of recurrent sinus infections and has similar symptoms as previous sinus infections. She states she gets them yearly. Her symptoms have been ongoing for 4 days. Worst symptom:Sinus pressure Fever: yes 99.5 F Cough: no Shortness of breath: no Wheezing: no Chest pain:no Chest tightness: no Chest congestion: no Nasal congestion: no Runny nose: no Post nasal drip:  yes Sneezing: yes Sore throat: no Swollen glands: yes Sinus pressure: yes Headache: yes Face pain: yes Toothache: no Ear pain: no  Ear pressure: yes bilateral Eyes red/itching:no Eye drainage/crusting: no  Vomiting: no Rash: no Fatigue: yes Sick contacts: no Strep contacts: no  Context: worse and fluctuating Recurrent sinusitis: yes  Treatments attempted:  Tried Tylenol and Aleve, Zyrtec D daily, and Flonase  with no relief  Relevant past medical, surgical, family and social history reviewed and updated as indicated. Interim medical history since our last visit reviewed. Allergies and medications reviewed and updated.  Review of Systems  Constitutional:  Positive for fatigue and fever. Negative for chills.  HENT:  Positive for postnasal drip, sinus pressure, sinus pain and sneezing. Negative for congestion, ear pain, rhinorrhea and sore throat.   Eyes:  Negative for discharge, redness and itching.  Respiratory:  Negative for chest tightness, shortness of breath and wheezing.   Cardiovascular:  Negative for chest pain.  Gastrointestinal:  Negative for vomiting.  Skin:  Negative for rash.  Neurological:  Positive for headaches.    Per HPI unless specifically indicated above     Objective:    BP (!) 122/90 Comment: Taken by patient  Pulse (!) 127 Comment: Taken by patient  Temp 99.5 F (37.5 C) Comment: Taken by patient  Wt Readings from Last 3 Encounters:  12/26/22 185 lb 3.2 oz (84 kg)  12/04/22 183 lb 4.8 oz (83.1 kg)  11/26/22 185 lb 14.4 oz (84.3 kg)    Physical Exam HENT:  Mouth/Throat:     Comments: Visualized bilateral puffiness in the maxillary sinuses, voice hoarseness, and nasal congestion present.     Results for orders placed or performed in visit on 01/03/23  Estrogens, Total  Result Value Ref Range   Estrogen 1,317 pg/mL  FSH/LH  Result Value Ref Range   LH 33.9 mIU/mL   FSH 6.5 mIU/mL      Assessment & Plan:   Problem List Items  Addressed This Visit     Sinusitis - Primary    Acute, ongoing. Augmentin BID ordered for 10 days, continue use of Flonase, Zyrtec, and Aleve as needed for relief. Recommend increased fluids for hydration. F/u if symptoms do not improve.       Relevant Medications   amoxicillin-clavulanate (AUGMENTIN) 875-125 MG tablet     Follow up plan: Return in about 5 weeks (around 02/25/2023) for BP Recheck and follow up.

## 2023-01-21 NOTE — Assessment & Plan Note (Addendum)
Acute, ongoing. Augmentin BID ordered for 10 days, continue use of Flonase, Zyrtec, and Aleve as needed for relief. Recommend increased fluids for hydration. F/u if symptoms do not improve.

## 2023-02-22 ENCOUNTER — Other Ambulatory Visit: Payer: Self-pay | Admitting: Nurse Practitioner

## 2023-02-24 NOTE — Telephone Encounter (Signed)
Requested Prescriptions  Pending Prescriptions Disp Refills   hydrochlorothiazide (HYDRODIURIL) 25 MG tablet [Pharmacy Med Name: HYDROCHLOROTHIAZIDE 25MG  TABLETS] 90 tablet 0    Sig: TAKE 1 TABLET(25 MG) BY MOUTH DAILY     Cardiovascular: Diuretics - Thiazide Failed - 02/22/2023  3:12 AM      Failed - Last BP in normal range    BP Readings from Last 1 Encounters:  01/21/23 (!) 122/90         Passed - Cr in normal range and within 180 days    Creatinine, Ser  Date Value Ref Range Status  11/27/2022 0.71 0.57 - 1.00 mg/dL Final         Passed - K in normal range and within 180 days    Potassium  Date Value Ref Range Status  11/27/2022 4.4 3.5 - 5.2 mmol/L Final         Passed - Na in normal range and within 180 days    Sodium  Date Value Ref Range Status  11/27/2022 138 134 - 144 mmol/L Final         Passed - Valid encounter within last 6 months    Recent Outpatient Visits           1 month ago Acute non-recurrent maxillary sinusitis   [place] [facility] Pearley, Sherran Needs, NP   2 months ago Essential hypertension   [place] [facility] Larae Grooms, NP   3 months ago Essential hypertension   [place] [facility] Larae Grooms, NP   1 year ago Fluid level behind tympanic membrane of both ears   [place] [facility] Larae Grooms, NP   1 year ago Wheezing   [place] [facility] Vigg, Avanti, MD       Future Appointments             Tomorrow Mecum, Oswaldo Conroy, PA-C [place] [facility]

## 2023-02-25 ENCOUNTER — Ambulatory Visit: Payer: Medicaid Other | Admitting: Physician Assistant

## 2023-03-02 ENCOUNTER — Other Ambulatory Visit: Payer: Self-pay | Admitting: Nurse Practitioner

## 2023-03-04 NOTE — Telephone Encounter (Signed)
Requested Prescriptions  Pending Prescriptions Disp Refills   hydrALAZINE (APRESOLINE) 10 MG tablet [Pharmacy Med Name: HYDRALAZINE 10 MG TABLETS (ORANGE)] 180 tablet 0    Sig: TAKE 1 TABLET(10 MG) BY MOUTH TWICE DAILY     Cardiovascular:  Vasodilators Failed - 03/02/2023  3:12 AM      Failed - ANA Screen, Ifa, Serum in normal range and within 360 days    No results found for: "ANA", "ANATITER", "LABANTI"       Failed - Last BP in normal range    BP Readings from Last 1 Encounters:  01/21/23 (!) 122/90         Passed - HCT in normal range and within 360 days    Hematocrit  Date Value Ref Range Status  11/27/2022 38.8 34.0 - 46.6 % Final         Passed - HGB in normal range and within 360 days    Hemoglobin  Date Value Ref Range Status  11/27/2022 13.2 11.1 - 15.9 g/dL Final         Passed - RBC in normal range and within 360 days    RBC  Date Value Ref Range Status  11/27/2022 4.28 3.77 - 5.28 x10E6/uL Final  11/24/2016 4.32 3.80 - 5.20 MIL/uL Final         Passed - WBC in normal range and within 360 days    WBC  Date Value Ref Range Status  11/27/2022 7.2 3.4 - 10.8 x10E3/uL Final  11/24/2016 12.4 (H) 3.6 - 11.0 K/uL Final         Passed - PLT in normal range and within 360 days    Platelets  Date Value Ref Range Status  11/27/2022 309 150 - 450 x10E3/uL Final         Passed - Valid encounter within last 12 months    Recent Outpatient Visits           1 month ago Acute non-recurrent maxillary sinusitis   [place] [facility] Lares, Sherran Needs, NP   2 months ago Essential hypertension   [place] [facility] Larae Grooms, NP   3 months ago Essential hypertension   [place] [facility] Larae Grooms, NP   1 year ago Fluid level behind tympanic membrane of both ears   [place] [facility] Larae Grooms, NP   1 year ago Wheezing   [place] [facility] Vigg,  Roma Schanz, MD

## 2023-03-23 ENCOUNTER — Other Ambulatory Visit: Payer: Self-pay | Admitting: Nurse Practitioner

## 2023-03-25 ENCOUNTER — Encounter: Payer: Medicaid Other | Admitting: Physician Assistant

## 2023-03-25 NOTE — Telephone Encounter (Signed)
Requested Prescriptions  Pending Prescriptions Disp Refills   valsartan (DIOVAN) 80 MG tablet [Pharmacy Med Name: VALSARTAN 80MG  TABLETS] 90 tablet 0    Sig: TAKE 1 TABLET(80 MG) BY MOUTH DAILY     Cardiovascular:  Angiotensin Receptor Blockers Failed - 03/23/2023  7:49 AM      Failed - Last BP in normal range    BP Readings from Last 1 Encounters:  01/21/23 (!) 122/90         Passed - Cr in normal range and within 180 days    Creatinine, Ser  Date Value Ref Range Status  11/27/2022 0.71 0.57 - 1.00 mg/dL Final         Passed - K in normal range and within 180 days    Potassium  Date Value Ref Range Status  11/27/2022 4.4 3.5 - 5.2 mmol/L Final         Passed - Patient is not pregnant      Passed - Valid encounter within last 6 months    Recent Outpatient Visits           2 months ago Acute non-recurrent maxillary sinusitis   [place] [facility] Pearley, Sherran Needs, NP   2 months ago Essential hypertension   [place] [facility] Larae Grooms, NP   3 months ago Essential hypertension   [place] [facility] Larae Grooms, NP   1 year ago Fluid level behind tympanic membrane of both ears   [place] [facility] Larae Grooms, NP   1 year ago Wheezing   [place] [facility] Vigg, Roma Schanz, MD

## 2023-11-03 ENCOUNTER — Other Ambulatory Visit: Payer: Self-pay | Admitting: Nurse Practitioner

## 2023-11-05 ENCOUNTER — Other Ambulatory Visit: Payer: Self-pay | Admitting: Nurse Practitioner

## 2023-11-05 NOTE — Telephone Encounter (Signed)
 Requested Prescriptions  Pending Prescriptions Disp Refills   hydrochlorothiazide (HYDRODIURIL) 25 MG tablet [Pharmacy Med Name: HYDROCHLOROTHIAZIDE 25MG  TABLETS] 30 tablet 0    Sig: TAKE 1 TABLET(25 MG) BY MOUTH DAILY     Cardiovascular: Diuretics - Thiazide Failed - 11/05/2023  8:19 AM      Failed - Cr in normal range and within 180 days    Creatinine, Ser  Date Value Ref Range Status  11/27/2022 0.71 0.57 - 1.00 mg/dL Final         Failed - K in normal range and within 180 days    Potassium  Date Value Ref Range Status  11/27/2022 4.4 3.5 - 5.2 mmol/L Final         Failed - Na in normal range and within 180 days    Sodium  Date Value Ref Range Status  11/27/2022 138 134 - 144 mmol/L Final         Failed - Last BP in normal range    BP Readings from Last 1 Encounters:  01/21/23 (!) 122/90         Failed - Valid encounter within last 6 months    Recent Outpatient Visits   None

## 2023-11-06 NOTE — Telephone Encounter (Signed)
 90 days supply not appropriate at this time, courtesy supply given. Requested Prescriptions  Pending Prescriptions Disp Refills   hydrochlorothiazide (HYDRODIURIL) 25 MG tablet [Pharmacy Med Name: HYDROCHLOROTHIAZIDE 25MG  TABLETS] 90 tablet     Sig: TAKE 1 TABLET(25 MG) BY MOUTH DAILY     Cardiovascular: Diuretics - Thiazide Failed - 11/06/2023  2:39 PM      Failed - Cr in normal range and within 180 days    Creatinine, Ser  Date Value Ref Range Status  11/27/2022 0.71 0.57 - 1.00 mg/dL Final         Failed - K in normal range and within 180 days    Potassium  Date Value Ref Range Status  11/27/2022 4.4 3.5 - 5.2 mmol/L Final         Failed - Na in normal range and within 180 days    Sodium  Date Value Ref Range Status  11/27/2022 138 134 - 144 mmol/L Final         Failed - Last BP in normal range    BP Readings from Last 1 Encounters:  01/21/23 (!) 122/90         Failed - Valid encounter within last 6 months    Recent Outpatient Visits   None

## 2023-12-10 ENCOUNTER — Encounter: Payer: Self-pay | Admitting: Nurse Practitioner

## 2023-12-10 ENCOUNTER — Ambulatory Visit (INDEPENDENT_AMBULATORY_CARE_PROVIDER_SITE_OTHER): Admitting: Nurse Practitioner

## 2023-12-10 VITALS — BP 134/81 | HR 83 | Temp 98.1°F | Resp 17 | Ht 62.01 in | Wt 199.2 lb

## 2023-12-10 DIAGNOSIS — E559 Vitamin D deficiency, unspecified: Secondary | ICD-10-CM

## 2023-12-10 DIAGNOSIS — E66812 Obesity, class 2: Secondary | ICD-10-CM | POA: Diagnosis not present

## 2023-12-10 DIAGNOSIS — H8102 Meniere's disease, left ear: Secondary | ICD-10-CM | POA: Diagnosis not present

## 2023-12-10 DIAGNOSIS — E7800 Pure hypercholesterolemia, unspecified: Secondary | ICD-10-CM | POA: Diagnosis not present

## 2023-12-10 DIAGNOSIS — I10 Essential (primary) hypertension: Secondary | ICD-10-CM

## 2023-12-10 MED ORDER — HYDRALAZINE HCL 10 MG PO TABS: 10.0000 mg | ORAL_TABLET | Freq: Three times a day (TID) | ORAL | 1 refills | Status: DC

## 2023-12-10 MED ORDER — MECLIZINE HCL 25 MG PO TABS: 25.0000 mg | ORAL_TABLET | Freq: Three times a day (TID) | ORAL | 0 refills | Status: DC | PRN

## 2023-12-10 MED ORDER — VALSARTAN 80 MG PO TABS: 80.0000 mg | ORAL_TABLET | Freq: Every day | ORAL | 1 refills | Status: DC

## 2023-12-10 MED ORDER — HYDROCHLOROTHIAZIDE 25 MG PO TABS: ORAL_TABLET | ORAL | 0 refills | Status: AC

## 2023-12-10 MED ORDER — SCOPOLAMINE 1 MG/3DAYS TD PT72
1.0000 | MEDICATED_PATCH | TRANSDERMAL | 1 refills | Status: AC
Start: 2023-12-10 — End: ?

## 2023-12-10 MED ORDER — PANTOPRAZOLE SODIUM 20 MG PO TBEC: DELAYED_RELEASE_TABLET | ORAL | 1 refills | Status: DC

## 2023-12-10 MED ORDER — WEGOVY 0.5 MG/0.5ML ~~LOC~~ SOAJ: 0.5000 mg | SUBCUTANEOUS | 2 refills | Status: DC

## 2023-12-10 MED ORDER — WEGOVY 0.25 MG/0.5ML ~~LOC~~ SOAJ
0.2500 mg | SUBCUTANEOUS | 0 refills | Status: DC
Start: 2023-12-10 — End: 2024-04-14

## 2023-12-10 MED ORDER — ONDANSETRON HCL 4 MG PO TABS: 4.0000 mg | ORAL_TABLET | Freq: Three times a day (TID) | ORAL | 1 refills | Status: AC | PRN

## 2023-12-10 MED ORDER — ROSUVASTATIN CALCIUM 5 MG PO TABS: 5.0000 mg | ORAL_TABLET | Freq: Every day | ORAL | 1 refills | Status: AC

## 2023-12-10 NOTE — Assessment & Plan Note (Signed)
 Chronic.  Not well controlled.  Patient has tried diet and exercise for weight loss without success.  Will start [facility] 0.25mg  weekly.  Will increase to [facility] 0.5mg  weekly after the first 4 weeks.  Discussed how to inject medication.  Discussed side effects and benefits of medication.  Follow up in 6 weeks.  Call sooner if concerns arise.

## 2023-12-10 NOTE — Assessment & Plan Note (Signed)
 Chronic. Ongoing.  Chronic.  Continue with current medication regimen. Encouraged patient to be consistent with medications.  Feels the most benefit out of scopolamine  patch.  Refills sent today.  Follow up in 6 months.  Call sooner if concerns arise.

## 2023-12-10 NOTE — Progress Notes (Signed)
 BP 134/81 (BP Location: Left Arm, Patient Position: Sitting, Cuff Size: Large)   Pulse 83   Temp 98.1 F (36.7 C) (Oral)   Resp 17   Ht 5' 2.01" (1.575 m)   Wt 199 lb 3.2 oz (90.4 kg)   SpO2 98%   BMI 36.42 kg/m    Subjective:    Patient ID: Dawn Taylor, female    DOB: [birth year], 53 y.o.   MRN: [id number]  HPI: Dawn Taylor is a 53 y.o. female  Chief Complaint  Patient presents with   Hypertension    Seems to be running normal the last week but had been running very high. Checks all the time at home.    Anxiety    No recent issues   Weight Gain    Drinking water, no snacking, walking 1/2-1 mile daily 6 days a week. Unsure why still is gaining. Gain of 20 lbs   DIZZINESS Patient is here for follow up on her Menieres.  She is dealing with it daily.  She feels like the patches help the most. She is taking the hydrochlorothiazide  daily.  She has ben out of her medications.    Duration: weeks Description of symptoms: room spinning Duration of episode: hours Dizziness frequency: recurrent Provoking factors:  laying down and moving. Aggravating factors:   movement Triggered by rolling over in bed: yes Triggered by bending over: yes Aggravated by head movement: yes Aggravated by exertion, coughing, loud noises: no Recent head injury: no Recent or current viral symptoms: no History of vasovagal episodes: no Nausea: yes Vomiting: no Tinnitus: yes Hearing loss: yes Aural fullness: no Headache: no Photophobia/phonophobia: no Unsteady gait: no Postural instability: no Diplopia, dysarthria, dysphagia or weakness: no Related to exertion: no Pallor: no Diaphoresis: no Dyspnea: no Chest pain: no   HYPERTENSION without Chronic Kidney Disease Hypertension status: uncontrolled  Satisfied with current treatment? no Duration of hypertension: years BP monitoring frequency:  not checking BP range:  BP medication side effects:  no Medication compliance: not  compliant Previous BP meds:amlodipine  Aspirin: no Recurrent headaches: yes Visual changes: no Palpitations: no Dyspnea: no Chest pain: no Lower extremity edema: no Dizzy/lightheaded: yes  Clinical coverage for weight loss GLP's   Medication being dispensed is Wegovy 3 mL/28 day. Titration doses are 2 mL/28 days.   [x]  Product being prescribed is FDA approved for the indication, age, weight (if applicable) and not does not exceed dosing limits per the Prescribing Information per the clinical conditions for use.  [x]  Patient's baseline weight measured within the last 45 days as required by provider before dispensing.  [x]  Patient is  new to therapy and One of the following:   []  The beneficiary is 54 years of age or over and has ONE of the following:  []  A BMI greater than or equal to 30 kg/m2  []  A BMI greater than or equal to 27 kg/m2 with at least one weight-related comorbidity/risk factor/complication (i.e. hypertension, type 2 diabetes, obstructive sleep apnea, cardiovascular disease, dyslipidemia)   If patient has one weight-related comorbidity/risk factor/complication (i.e. hypertension, type 2 diabetes, obstructive sleep apnea, cardiovascular disease, dyslipidemia), please list htn and hld Patient suffers from weight-related comorbidity/risk factor/complication  HTN and HLD    [x]  The beneficiary is 37 years of age or older with a BMI greater than or equal to 27 kg/m2 AND has established cardiovascular disease (CVD) defined as having a history of myocardial infarction, stroke, or symptomatic peripheral disease, to be documented on the  PA form. AND  [x]  The beneficiary is currently on and will continue lifestyle modification including structured nutrition and physical activity, unless physical activity is not clinically appropriate at the time GLP1 therapy commences AND  []  The beneficiary will NOT be using the requested agent in combination with another GLP-1 receptor  agonist agent AND  []  The beneficiary does NOT have any FDA-labeled contraindications to the requested agent, including pregnancy, lactation, history of medullary thyroid  cancer or multiple endocrine neoplasia type II.   Last BMI/Weight/Height recorded Estimated body mass index is 36.42 kg/m as calculated from the following:   Height as of this encounter: 5' 2.01" (1.575 m).   Weight as of this encounter: 199 lb 3.2 oz (90.4 kg).        Relevant past medical, surgical, family and social history reviewed and updated as indicated. Interim medical history since our last visit reviewed. Allergies and medications reviewed and updated.  Review of Systems  Constitutional:  Positive for unexpected weight change.  Eyes:  Negative for visual disturbance.  Respiratory:  Negative for cough, chest tightness and shortness of breath.   Cardiovascular:  Negative for chest pain, palpitations and leg swelling.  Gastrointestinal:        Reflux.  Genitourinary:  Positive for vaginal bleeding. Negative for vaginal pain.  Neurological:  Positive for dizziness. Negative for headaches.    Per HPI unless specifically indicated above     Objective:    BP 134/81 (BP Location: Left Arm, Patient Position: Sitting, Cuff Size: Large)   Pulse 83   Temp 98.1 F (36.7 C) (Oral)   Resp 17   Ht 5' 2.01" (1.575 m)   Wt 199 lb 3.2 oz (90.4 kg)   SpO2 98%   BMI 36.42 kg/m   Wt Readings from Last 3 Encounters:  12/10/23 199 lb 3.2 oz (90.4 kg)  12/26/22 185 lb 3.2 oz (84 kg)  12/04/22 183 lb 4.8 oz (83.1 kg)    Physical Exam Vitals and nursing note reviewed.  Constitutional:      General: She is not in acute distress.    Appearance: Normal appearance. She is obese. She is not ill-appearing, toxic-appearing or diaphoretic.  HENT:     Head: Normocephalic.     Right Ear: External ear normal. A middle ear effusion is present. Tympanic membrane is not erythematous.     Left Ear: External ear normal. A  middle ear effusion is present. Tympanic membrane is not erythematous.     Nose: Nose normal.     Mouth/Throat:     Mouth: Mucous membranes are moist.     Pharynx: Oropharynx is clear.  Eyes:     General:        Right eye: No discharge.        Left eye: No discharge.     Extraocular Movements: Extraocular movements intact.     Conjunctiva/sclera: Conjunctivae normal.     Pupils: Pupils are equal, round, and reactive to light.  Cardiovascular:     Rate and Rhythm: Normal rate and regular rhythm.     Heart sounds: No murmur heard. Pulmonary:     Effort: Pulmonary effort is normal. No respiratory distress.     Breath sounds: Normal breath sounds. No wheezing or rales.  Musculoskeletal:     Cervical back: Normal range of motion and neck supple.  Skin:    General: Skin is warm and dry.     Capillary Refill: Capillary refill takes less than 2 seconds.  Neurological:  General: No focal deficit present.     Mental Status: She is alert and oriented to person, place, and time. Mental status is at baseline.  Psychiatric:        Mood and Affect: Mood normal.        Behavior: Behavior normal.        Thought Content: Thought content normal.        Judgment: Judgment normal.     Results for orders placed or performed in visit on 01/03/23  Estrogens , Total   Collection Time: 01/03/23  4:09 PM  Result Value Ref Range   Estrogen 1,317 pg/mL  FSH/LH   Collection Time: 01/03/23  4:09 PM  Result Value Ref Range   LH 33.9 mIU/mL   FSH 6.5 mIU/mL      Assessment & Plan:   Problem List Items Addressed This Visit       Cardiovascular and Mediastinum   Essential hypertension - Primary   Chronic. Not well controlled.  Tolerating the HCTZ, Valsartan , and Hydralazine  well.  Labs ordered today. Side effects and benefits discussed during visit.  Follow up in 6 months.  Call sooner if concerns arise.       Relevant Medications   hydrALAZINE  (APRESOLINE ) 10 MG tablet   hydrochlorothiazide   (HYDRODIURIL ) 25 MG tablet   rosuvastatin  (CRESTOR ) 5 MG tablet   valsartan  (DIOVAN ) 80 MG tablet   Other Relevant Orders   Comp Met (CMET)     Nervous and Auditory   Meniere disease, left   Chronic. Ongoing.  Chronic.  Continue with current medication regimen. Encouraged patient to be consistent with medications.  Feels the most benefit out of scopolamine  patch.  Refills sent today.  Follow up in 6 months.  Call sooner if concerns arise.         Other   Hypercholesteremia   Labs ordered at visit today.  Will make recommendations based on lab results.       Relevant Medications   hydrALAZINE  (APRESOLINE ) 10 MG tablet   hydrochlorothiazide  (HYDRODIURIL ) 25 MG tablet   rosuvastatin  (CRESTOR ) 5 MG tablet   valsartan  (DIOVAN ) 80 MG tablet   Other Relevant Orders   Lipid panel   Vitamin D  deficiency   Labs ordered at visit today.  Will make recommendations based on lab results.        Relevant Orders   Vitamin D  (25 hydroxy)   Class II obesity   Chronic.  Not well controlled.  Patient has tried diet and exercise for weight loss without success.  Will start [facility] 0.25mg  weekly.  Will increase to [facility] 0.5mg  weekly after the first 4 weeks.  Discussed how to inject medication.  Discussed side effects and benefits of medication.  Follow up in 6 weeks.  Call sooner if concerns arise.           Follow up plan: Return in about 6 weeks (around 01/21/2024) for Weight Managment.

## 2023-12-10 NOTE — Assessment & Plan Note (Signed)
 Labs ordered at visit today.  Will make recommendations based on lab results.

## 2023-12-10 NOTE — Assessment & Plan Note (Signed)
 Chronic. Not well controlled.  Tolerating the HCTZ, Valsartan , and Hydralazine  well.  Labs ordered today. Side effects and benefits discussed during visit.  Follow up in 6 months.  Call sooner if concerns arise.

## 2023-12-12 ENCOUNTER — Encounter: Payer: Self-pay | Admitting: Nurse Practitioner

## 2023-12-12 LAB — VITAMIN D 25 HYDROXY (VIT D DEFICIENCY, FRACTURES): Vit D, 25-Hydroxy: 39.9 ng/mL (ref 30.0–100.0)

## 2023-12-12 LAB — COMPREHENSIVE METABOLIC PANEL WITH GFR
ALT: 19 IU/L (ref 0–32)
AST: 17 IU/L (ref 0–40)
Albumin: 4.5 g/dL (ref 3.8–4.9)
Alkaline Phosphatase: 63 IU/L (ref 44–121)
BUN/Creatinine Ratio: 16 (ref 9–23)
BUN: 13 mg/dL (ref 6–24)
Bilirubin Total: 0.2 mg/dL (ref 0.0–1.2)
CO2: 21 mmol/L (ref 20–29)
Calcium: 9.8 mg/dL (ref 8.7–10.2)
Chloride: 106 mmol/L (ref 96–106)
Creatinine, Ser: 0.83 mg/dL (ref 0.57–1.00)
Globulin, Total: 2.8 g/dL (ref 1.5–4.5)
Glucose: 91 mg/dL (ref 70–99)
Potassium: 4.4 mmol/L (ref 3.5–5.2)
Sodium: 143 mmol/L (ref 134–144)
Total Protein: 7.3 g/dL (ref 6.0–8.5)
eGFR: 84 mL/min/{1.73_m2} (ref >59)

## 2023-12-12 LAB — LIPID PANEL
Chol/HDL Ratio: 7.5 ratio — ABNORMAL HIGH (ref 0.0–4.4)
Cholesterol, Total: 256 mg/dL — ABNORMAL HIGH (ref 100–199)
HDL: 34 mg/dL — ABNORMAL LOW (ref >39)
LDL Chol Calc (NIH): 158 mg/dL — ABNORMAL HIGH (ref 0–99)
Triglycerides: 337 mg/dL — ABNORMAL HIGH (ref 0–149)
VLDL Cholesterol Cal: 64 mg/dL — ABNORMAL HIGH (ref 5–40)

## 2023-12-23 ENCOUNTER — Telehealth: Payer: Self-pay

## 2023-12-23 NOTE — Telephone Encounter (Signed)
 Pharmacy Patient Advocate Encounter   Received notification from Onbase that prior authorization for Wegovy  0.25MG /0.5ML auto-injectors is required/requested.   Insurance verification completed.   The patient is insured through [facility] .   Per test claim: PA required; PA submitted to above mentioned insurance via CoverMyMeds Key/confirmation #/EOC [id number] Status is pending

## 2023-12-25 ENCOUNTER — Other Ambulatory Visit (HOSPITAL_COMMUNITY): Payer: Self-pay

## 2023-12-30 ENCOUNTER — Other Ambulatory Visit (HOSPITAL_COMMUNITY): Payer: Self-pay

## 2024-01-06 ENCOUNTER — Other Ambulatory Visit (HOSPITAL_COMMUNITY): Payer: Self-pay

## 2024-01-06 NOTE — Telephone Encounter (Signed)
 I ran into similar CMM issues, thankfully, PA has been approved, and has already been filled by pt's pharmacy. Thank you

## 2024-01-06 NOTE — Telephone Encounter (Signed)
 Pharmacy Patient Advocate Encounter   Received notification from Patient Advice Request messages that prior authorization for Wegovy  0.25MG /0.5ML auto-injectors is required/requested.   Insurance verification completed.   The patient is insured through [facility] MEDICAID .   Per test claim: PA required and submitted KEY/EOC/Request #: [id number] from 01/01/2024 to 07/03/2024 [id number]  *New key

## 2024-01-21 ENCOUNTER — Encounter: Payer: Self-pay | Admitting: Nurse Practitioner

## 2024-01-21 ENCOUNTER — Ambulatory Visit (INDEPENDENT_AMBULATORY_CARE_PROVIDER_SITE_OTHER): Admitting: Nurse Practitioner

## 2024-01-21 VITALS — BP 148/85 | HR 87 | Ht 62.0 in | Wt 198.0 lb

## 2024-01-21 DIAGNOSIS — I10 Essential (primary) hypertension: Secondary | ICD-10-CM

## 2024-01-21 DIAGNOSIS — E66812 Obesity, class 2: Secondary | ICD-10-CM

## 2024-01-21 MED ORDER — WEGOVY 0.5 MG/0.5ML ~~LOC~~ SOAJ: 0.5000 mg | SUBCUTANEOUS | 2 refills | Status: DC

## 2024-01-21 NOTE — Progress Notes (Signed)
 BP (!) 148/85   Pulse 87   Ht 5' 2 (1.575 m)   Wt 198 lb (89.8 kg)   SpO2 99%   BMI 36.21 kg/m    Subjective:    Patient ID: Dawn Taylor, female    DOB: [birth year], 53 y.o.   MRN: [id number]  HPI: Dawn Taylor is a 53 y.o. female  Chief Complaint  Patient presents with   Weight Check   WEIGHT MANAGEMENT Patient states she hasn't felt any difference with the Wegovy .  She has been working out and drinking water.  She hasn't had any side effects from the medication.   Duration: years Previous attempts at weight loss: yes Complications of obesity:  HTN Peak weight: 199lb Weight loss goal: 150lb Weight loss to date: 6 months  Relevant past medical, surgical, family and social history reviewed and updated as indicated. Interim medical history since our last visit reviewed. Allergies and medications reviewed and updated.  Review of Systems  Constitutional:  Negative for unexpected weight change.    Per HPI unless specifically indicated above     Objective:    BP (!) 148/85   Pulse 87   Ht 5' 2 (1.575 m)   Wt 198 lb (89.8 kg)   SpO2 99%   BMI 36.21 kg/m   Wt Readings from Last 3 Encounters:  01/21/24 198 lb (89.8 kg)  12/10/23 199 lb 3.2 oz (90.4 kg)  12/26/22 185 lb 3.2 oz (84 kg)    Physical Exam Vitals and nursing note reviewed.  Constitutional:      General: She is not in acute distress.    Appearance: Normal appearance. She is obese. She is not ill-appearing, toxic-appearing or diaphoretic.  HENT:     Head: Normocephalic.     Right Ear: External ear normal.     Left Ear: External ear normal.     Nose: Nose normal.     Mouth/Throat:     Mouth: Mucous membranes are moist.     Pharynx: Oropharynx is clear.   Eyes:     General:        Right eye: No discharge.        Left eye: No discharge.     Extraocular Movements: Extraocular movements intact.     Conjunctiva/sclera: Conjunctivae normal.     Pupils: Pupils are equal, round, and reactive to  light.    Cardiovascular:     Rate and Rhythm: Normal rate and regular rhythm.     Heart sounds: No murmur heard. Pulmonary:     Effort: Pulmonary effort is normal. No respiratory distress.     Breath sounds: Normal breath sounds. No wheezing or rales.   Musculoskeletal:     Cervical back: Normal range of motion and neck supple.   Skin:    General: Skin is warm and dry.     Capillary Refill: Capillary refill takes less than 2 seconds.   Neurological:     General: No focal deficit present.     Mental Status: She is alert and oriented to person, place, and time. Mental status is at baseline.   Psychiatric:        Mood and Affect: Mood normal.        Behavior: Behavior normal.        Thought Content: Thought content normal.        Judgment: Judgment normal.     Results for orders placed or performed in visit on 12/10/23  Comp Met (CMET)  Collection Time: 12/10/23  1:45 PM  Result Value Ref Range   Glucose 91 70 - 99 mg/dL   BUN 13 6 - 24 mg/dL   Creatinine, Ser 9.52 0.57 - 1.00 mg/dL   eGFR 84 >84 XL/KGM/0.10   BUN/Creatinine Ratio 16 9 - 23   Sodium 143 134 - 144 mmol/L   Potassium 4.4 3.5 - 5.2 mmol/L   Chloride 106 96 - 106 mmol/L   CO2 21 20 - 29 mmol/L   Calcium  9.8 8.7 - 10.2 mg/dL   Total Protein 7.3 6.0 - 8.5 g/dL   Albumin 4.5 3.8 - 4.9 g/dL   Globulin, Total 2.8 1.5 - 4.5 g/dL   Bilirubin Total 0.2 0.0 - 1.2 mg/dL   Alkaline Phosphatase 63 44 - 121 IU/L   AST 17 0 - 40 IU/L   ALT 19 0 - 32 IU/L  Lipid panel   Collection Time: 12/10/23  1:45 PM  Result Value Ref Range   Cholesterol, Total 256 (H) 100 - 199 mg/dL   Triglycerides 272 (H) 0 - 149 mg/dL   HDL 34 (L) >53 mg/dL   VLDL Cholesterol Cal 64 (H) 5 - 40 mg/dL   LDL Chol Calc (NIH) 664 (H) 0 - 99 mg/dL   Chol/HDL Ratio 7.5 (H) 0.0 - 4.4 ratio  Vitamin D  (25 hydroxy)   Collection Time: 12/10/23  1:45 PM  Result Value Ref Range   Vit D, 25-Hydroxy 39.9 30.0 - 100.0 ng/mL      Assessment &  Plan:   Problem List Items Addressed This Visit       Cardiovascular and Mediastinum   Essential hypertension   Chronic. Not well controlled.  Did not tolerate valsartan .  Only taking Hydralazine  BID.  Discussed increasing to TID as written.  Follow up in 1 month.        Other   Class II obesity - Primary   Chronic.  Not well controlled.  Patient has tried diet and exercise for weight loss without success.  Will increase to Wegovy  0.5mg  weekly.   Follow up in 1 months.  Call sooner if concerns arise.          Follow up plan: Return in about 1 month (around 02/20/2024) for Weight Managment.

## 2024-01-21 NOTE — Assessment & Plan Note (Signed)
 Chronic. Not well controlled.  Did not tolerate valsartan .  Only taking Hydralazine  BID.  Discussed increasing to TID as written.  Follow up in 1 month.

## 2024-01-21 NOTE — Assessment & Plan Note (Signed)
 Chronic.  Not well controlled.  Patient has tried diet and exercise for weight loss without success.  Will increase to Wegovy  0.5mg  weekly.   Follow up in 1 months.  Call sooner if concerns arise.

## 2024-02-23 ENCOUNTER — Encounter: Payer: Self-pay | Admitting: Nurse Practitioner

## 2024-02-23 ENCOUNTER — Ambulatory Visit (INDEPENDENT_AMBULATORY_CARE_PROVIDER_SITE_OTHER): Admitting: Nurse Practitioner

## 2024-02-23 VITALS — BP 121/82 | HR 96 | Temp 98.6°F | Wt 193.4 lb

## 2024-02-23 DIAGNOSIS — E66812 Obesity, class 2: Secondary | ICD-10-CM | POA: Diagnosis not present

## 2024-02-23 NOTE — Assessment & Plan Note (Signed)
 Chronic.  Improved.  Has lost 5lbs in the last month.  Continue with Wegovy  0.5mg  weekly.  Follow up in 2 months.  Call sooner if concerns arise.

## 2024-02-23 NOTE — Progress Notes (Signed)
 BP 121/82 Comment: Home blood pressure reading  Pulse 96   Temp 98.6 F (37 C) (Oral)   Wt 193 lb 6.4 oz (87.7 kg)   LMP  (LMP Unknown)   SpO2 98%   BMI 35.37 kg/m    Subjective:    Patient ID: Dawn Taylor, female    DOB: 02/15/1971, 53 y.o.   MRN: [id number]  HPI: Dawn Taylor is a 53 y.o. female  Chief Complaint  Patient presents with   Weight Management Screening   WEIGHT MANAGEMENT Patient has lost 5lbs with Wegovy .  She has taken 1 dose of the of the 0.5mg  dose.  She is tolerating the medication well.  Denies side effects at this time. Duration: years Previous attempts at weight loss: yes Complications of obesity:  HTN Peak weight: 199lb Weight loss goal: 150lb Weight loss to date: 6 months  Relevant past medical, surgical, family and social history reviewed and updated as indicated. Interim medical history since our last visit reviewed. Allergies and medications reviewed and updated.  Review of Systems  Constitutional:  Negative for unexpected weight change.    Per HPI unless specifically indicated above     Objective:    BP 121/82 Comment: Home blood pressure reading  Pulse 96   Temp 98.6 F (37 C) (Oral)   Wt 193 lb 6.4 oz (87.7 kg)   LMP  (LMP Unknown)   SpO2 98%   BMI 35.37 kg/m   Wt Readings from Last 3 Encounters:  02/23/24 193 lb 6.4 oz (87.7 kg)  01/21/24 198 lb (89.8 kg)  12/10/23 199 lb 3.2 oz (90.4 kg)    Physical Exam Vitals and nursing note reviewed.  Constitutional:      General: She is not in acute distress.    Appearance: Normal appearance. She is obese. She is not ill-appearing, toxic-appearing or diaphoretic.  HENT:     Head: Normocephalic.     Right Ear: External ear normal.     Left Ear: External ear normal.     Nose: Nose normal.     Mouth/Throat:     Mouth: Mucous membranes are moist.     Pharynx: Oropharynx is clear.  Eyes:     General:        Right eye: No discharge.        Left eye: No discharge.      Extraocular Movements: Extraocular movements intact.     Conjunctiva/sclera: Conjunctivae normal.     Pupils: Pupils are equal, round, and reactive to light.  Cardiovascular:     Rate and Rhythm: Normal rate and regular rhythm.     Heart sounds: No murmur heard. Pulmonary:     Effort: Pulmonary effort is normal. No respiratory distress.     Breath sounds: Normal breath sounds. No wheezing or rales.  Musculoskeletal:     Cervical back: Normal range of motion and neck supple.  Skin:    General: Skin is warm and dry.     Capillary Refill: Capillary refill takes less than 2 seconds.  Neurological:     General: No focal deficit present.     Mental Status: She is alert and oriented to person, place, and time. Mental status is at baseline.  Psychiatric:        Mood and Affect: Mood normal.        Behavior: Behavior normal.        Thought Content: Thought content normal.        Judgment: Judgment normal.  Results for orders placed or performed in visit on 12/10/23  Comp Met (CMET)   Collection Time: 12/10/23  1:45 PM  Result Value Ref Range   Glucose 91 70 - 99 mg/dL   BUN 13 6 - 24 mg/dL   Creatinine, Ser 9.16 0.57 - 1.00 mg/dL   eGFR 84 >40 fO/fpw/8.26   BUN/Creatinine Ratio 16 9 - 23   Sodium 143 134 - 144 mmol/L   Potassium 4.4 3.5 - 5.2 mmol/L   Chloride 106 96 - 106 mmol/L   CO2 21 20 - 29 mmol/L   Calcium  9.8 8.7 - 10.2 mg/dL   Total Protein 7.3 6.0 - 8.5 g/dL   Albumin 4.5 3.8 - 4.9 g/dL   Globulin, Total 2.8 1.5 - 4.5 g/dL   Bilirubin Total 0.2 0.0 - 1.2 mg/dL   Alkaline Phosphatase 63 44 - 121 IU/L   AST 17 0 - 40 IU/L   ALT 19 0 - 32 IU/L  Lipid panel   Collection Time: 12/10/23  1:45 PM  Result Value Ref Range   Cholesterol, Total 256 (H) 100 - 199 mg/dL   Triglycerides 662 (H) 0 - 149 mg/dL   HDL 34 (L) >60 mg/dL   VLDL Cholesterol Cal 64 (H) 5 - 40 mg/dL   LDL Chol Calc (NIH) 841 (H) 0 - 99 mg/dL   Chol/HDL Ratio 7.5 (H) 0.0 - 4.4 ratio  Vitamin D  (25  hydroxy)   Collection Time: 12/10/23  1:45 PM  Result Value Ref Range   Vit D, 25-Hydroxy 39.9 30.0 - 100.0 ng/mL      Assessment & Plan:   Problem List Items Addressed This Visit       Other   Class II obesity - Primary   Chronic.  Improved.  Has lost 5lbs in the last month.  Continue with Wegovy  0.5mg  weekly.  Follow up in 2 months.  Call sooner if concerns arise.          Follow up plan: Return in about 2 months (around 04/25/2024) for Weight Managment.

## 2024-03-03 ENCOUNTER — Encounter: Payer: Self-pay | Admitting: Nurse Practitioner

## 2024-04-14 ENCOUNTER — Encounter: Payer: Self-pay | Admitting: Nurse Practitioner

## 2024-04-14 ENCOUNTER — Ambulatory Visit: Admitting: Nurse Practitioner

## 2024-04-14 VITALS — BP 149/84 | HR 93 | Temp 98.5°F | Ht 62.0 in | Wt 189.8 lb

## 2024-04-14 DIAGNOSIS — I10 Essential (primary) hypertension: Secondary | ICD-10-CM | POA: Diagnosis not present

## 2024-04-14 DIAGNOSIS — H6593 Unspecified nonsuppurative otitis media, bilateral: Secondary | ICD-10-CM | POA: Diagnosis not present

## 2024-04-14 DIAGNOSIS — E66812 Obesity, class 2: Secondary | ICD-10-CM

## 2024-04-14 MED ORDER — FEXOFENADINE HCL 180 MG PO TABS: 180.0000 mg | ORAL_TABLET | Freq: Every day | ORAL | 1 refills | Status: AC

## 2024-04-14 MED ORDER — WEGOVY 1 MG/0.5ML ~~LOC~~ SOAJ: 1.0000 mg | SUBCUTANEOUS | 2 refills | Status: DC

## 2024-04-14 MED ORDER — MECLIZINE HCL 25 MG PO TABS: 25.0000 mg | ORAL_TABLET | Freq: Three times a day (TID) | ORAL | 0 refills | Status: AC | PRN

## 2024-04-14 MED ORDER — METHYLPREDNISOLONE 4 MG PO TBPK: ORAL_TABLET | ORAL | 0 refills | Status: DC

## 2024-04-14 MED ORDER — FLUTICASONE PROPIONATE 50 MCG/ACT NA SUSP: 2.0000 | Freq: Every day | NASAL | 6 refills | Status: AC

## 2024-04-14 NOTE — Assessment & Plan Note (Signed)
 Chronic.  Elevated in the office but well controlled at home.  Continue with current medication regimen.  Follow up as discussed.

## 2024-04-14 NOTE — Progress Notes (Signed)
 BP (!) 149/84 (BP Location: Left Arm, Cuff Size: Normal)   Pulse 93   Temp 98.5 F (36.9 C) (Oral)   Ht 5' 2 (1.575 m)   Wt 189 lb 12.8 oz (86.1 kg)   SpO2 98%   BMI 34.71 kg/m    Subjective:    Patient ID: Dawn Taylor, female    DOB: 12-14-1970, 53 y.o.   MRN: [id number]  HPI: Dawn Taylor is a 53 y.o. female  Chief Complaint  Patient presents with   Obesity   WEIGHT MANAGEMENT Patient has lost 10lbs with Wegovy . She has taken the 0.5mg  dose and done well with it.  She is tolerating the medication well.  Denies side effects at this time. Duration: years Previous attempts at weight loss: yes Complications of obesity:  HTN Peak weight: 199lb Weight loss goal: 150lb Weight loss to date: 6 months  HYPERTENSION without Chronic Kidney Disease Hypertension status: uncontrolled  Satisfied with current treatment? no Duration of hypertension: years BP monitoring frequency:  daily BP range: 123/74 BP medication side effects:  no Medication compliance: excellent compliance Previous BP meds:hydralazine  Aspirin: no Recurrent headaches: no Visual changes: no Palpitations: no Dyspnea: no Chest pain: no Lower extremity edema: no Dizzy/lightheaded: no  Patient states she feels like her ears are clogged.  She is taking Allegra  every other day.     Relevant past medical, surgical, family and social history reviewed and updated as indicated. Interim medical history since our last visit reviewed. Allergies and medications reviewed and updated.  Review of Systems  Constitutional:  Negative for unexpected weight change.  HENT:         Ears feel clogged  Eyes:  Negative for visual disturbance.  Respiratory:  Negative for cough, chest tightness and shortness of breath.   Cardiovascular:  Negative for chest pain, palpitations and leg swelling.  Neurological:  Negative for dizziness and headaches.    Per HPI unless specifically indicated above     Objective:    BP  (!) 149/84 (BP Location: Left Arm, Cuff Size: Normal)   Pulse 93   Temp 98.5 F (36.9 C) (Oral)   Ht 5' 2 (1.575 m)   Wt 189 lb 12.8 oz (86.1 kg)   SpO2 98%   BMI 34.71 kg/m   Wt Readings from Last 3 Encounters:  04/14/24 189 lb 12.8 oz (86.1 kg)  02/23/24 193 lb 6.4 oz (87.7 kg)  01/21/24 198 lb (89.8 kg)    Physical Exam Vitals and nursing note reviewed.  Constitutional:      General: She is not in acute distress.    Appearance: Normal appearance. She is obese. She is not ill-appearing, toxic-appearing or diaphoretic.  HENT:     Head: Normocephalic.     Right Ear: External ear normal. A middle ear effusion is present.     Left Ear: External ear normal. A middle ear effusion is present.     Nose: Nose normal.     Mouth/Throat:     Mouth: Mucous membranes are moist.     Pharynx: Oropharynx is clear.  Eyes:     General:        Right eye: No discharge.        Left eye: No discharge.     Extraocular Movements: Extraocular movements intact.     Conjunctiva/sclera: Conjunctivae normal.     Pupils: Pupils are equal, round, and reactive to light.  Cardiovascular:     Rate and Rhythm: Normal rate and regular  rhythm.     Heart sounds: No murmur heard. Pulmonary:     Effort: Pulmonary effort is normal. No respiratory distress.     Breath sounds: Normal breath sounds. No wheezing or rales.  Musculoskeletal:     Cervical back: Normal range of motion and neck supple.  Skin:    General: Skin is warm and dry.     Capillary Refill: Capillary refill takes less than 2 seconds.  Neurological:     General: No focal deficit present.     Mental Status: She is alert and oriented to person, place, and time. Mental status is at baseline.  Psychiatric:        Mood and Affect: Mood normal.        Behavior: Behavior normal.        Thought Content: Thought content normal.        Judgment: Judgment normal.     Results for orders placed or performed in visit on 12/10/23  Comp Met (CMET)    Collection Time: 12/10/23  1:45 PM  Result Value Ref Range   Glucose 91 70 - 99 mg/dL   BUN 13 6 - 24 mg/dL   Creatinine, Ser 9.16 0.57 - 1.00 mg/dL   eGFR 84 >40 fO/fpw/8.26   BUN/Creatinine Ratio 16 9 - 23   Sodium 143 134 - 144 mmol/L   Potassium 4.4 3.5 - 5.2 mmol/L   Chloride 106 96 - 106 mmol/L   CO2 21 20 - 29 mmol/L   Calcium  9.8 8.7 - 10.2 mg/dL   Total Protein 7.3 6.0 - 8.5 g/dL   Albumin 4.5 3.8 - 4.9 g/dL   Globulin, Total 2.8 1.5 - 4.5 g/dL   Bilirubin Total 0.2 0.0 - 1.2 mg/dL   Alkaline Phosphatase 63 44 - 121 IU/L   AST 17 0 - 40 IU/L   ALT 19 0 - 32 IU/L  Lipid panel   Collection Time: 12/10/23  1:45 PM  Result Value Ref Range   Cholesterol, Total 256 (H) 100 - 199 mg/dL   Triglycerides 662 (H) 0 - 149 mg/dL   HDL 34 (L) >60 mg/dL   VLDL Cholesterol Cal 64 (H) 5 - 40 mg/dL   LDL Chol Calc (NIH) 841 (H) 0 - 99 mg/dL   Chol/HDL Ratio 7.5 (H) 0.0 - 4.4 ratio  Vitamin D  (25 hydroxy)   Collection Time: 12/10/23  1:45 PM  Result Value Ref Range   Vit D, 25-Hydroxy 39.9 30.0 - 100.0 ng/mL      Assessment & Plan:   Problem List Items Addressed This Visit       Cardiovascular and Mediastinum   Essential hypertension   Chronic.  Elevated in the office but well controlled at home.  Continue with current medication regimen.  Follow up as discussed.         Other   Class II obesity   Chronic.  Improved.  Has lost 10lbs on Wegovy . Continue with Wegovy  1mg  weekly.  Follow up in 3 months.  Call sooner if concerns arise.       Other Visit Diagnoses       Fluid level behind tympanic membrane of both ears    -  Primary   Will treat with medrol  dose pak. Complete course of medication. Continue with allegra  and flonase .          Follow up plan: Return in about 3 months (around 07/14/2024) for HTN, HLD, DM2 FU.

## 2024-04-14 NOTE — Assessment & Plan Note (Signed)
 Chronic.  Improved.  Has lost 10lbs on Wegovy . Continue with Wegovy  1mg  weekly.  Follow up in 3 months.  Call sooner if concerns arise.

## 2024-04-19 ENCOUNTER — Encounter: Payer: Self-pay | Admitting: Nurse Practitioner

## 2024-04-21 ENCOUNTER — Encounter: Payer: Self-pay | Admitting: Nurse Practitioner

## 2024-04-21 ENCOUNTER — Ambulatory Visit: Admitting: Nurse Practitioner

## 2024-04-21 VITALS — BP 159/98 | HR 105 | Temp 98.3°F | Ht 62.0 in | Wt 190.4 lb

## 2024-04-21 DIAGNOSIS — H6690 Otitis media, unspecified, unspecified ear: Secondary | ICD-10-CM | POA: Diagnosis not present

## 2024-04-21 MED ORDER — AMOXICILLIN 500 MG PO CAPS: 500.0000 mg | ORAL_CAPSULE | Freq: Two times a day (BID) | ORAL | 0 refills | Status: DC

## 2024-04-21 NOTE — Progress Notes (Signed)
 BP (!) 159/98 (BP Location: Left Arm, Patient Position: Sitting, Cuff Size: Large)   Pulse (!) 105   Temp 98.3 F (36.8 C)   Ht 5' 2 (1.575 m)   Wt 190 lb 6.4 oz (86.4 kg)   SpO2 96%   BMI 34.82 kg/m    Subjective:    Patient ID: Dawn Taylor, female    DOB: 1970/10/25, 53 y.o.   MRN: [id number]  HPI: Dawn Taylor is a 53 y.o. female  Chief Complaint  Patient presents with   Ear Pain    Patient states she has been having pain in her right ear for about a week. No drainage came out of her ear. Says her ear feels full.   EAR PAIN Duration: 1 week Involved ear(s): right ear Severity:  7/10  Quality:  sharp Fever: yes Otorrhea: no Upper respiratory infection symptoms: yes Pruritus: no Hearing loss: yes Water immersion no Using Q-tips: no Recurrent otitis media: no Status: stable Treatments attempted: none  Relevant past medical, surgical, family and social history reviewed and updated as indicated. Interim medical history since our last visit reviewed. Allergies and medications reviewed and updated.  Review of Systems  Constitutional:  Positive for fever.  HENT:  Positive for congestion and ear pain.     Per HPI unless specifically indicated above     Objective:    BP (!) 159/98 (BP Location: Left Arm, Patient Position: Sitting, Cuff Size: Large)   Pulse (!) 105   Temp 98.3 F (36.8 C)   Ht 5' 2 (1.575 m)   Wt 190 lb 6.4 oz (86.4 kg)   SpO2 96%   BMI 34.82 kg/m   Wt Readings from Last 3 Encounters:  04/21/24 190 lb 6.4 oz (86.4 kg)  04/14/24 189 lb 12.8 oz (86.1 kg)  02/23/24 193 lb 6.4 oz (87.7 kg)    Physical Exam Vitals and nursing note reviewed.  Constitutional:      General: She is not in acute distress.    Appearance: Normal appearance. She is normal weight. She is not ill-appearing, toxic-appearing or diaphoretic.  HENT:     Head: Normocephalic.     Right Ear: External ear normal. Tenderness present. A middle ear effusion is  present. Tympanic membrane is erythematous.     Left Ear: External ear normal. Tenderness present. A middle ear effusion is present. Tympanic membrane is erythematous.     Nose: Nose normal.     Mouth/Throat:     Mouth: Mucous membranes are moist.     Pharynx: Oropharynx is clear.  Eyes:     General:        Right eye: No discharge.        Left eye: No discharge.     Extraocular Movements: Extraocular movements intact.     Conjunctiva/sclera: Conjunctivae normal.     Pupils: Pupils are equal, round, and reactive to light.  Cardiovascular:     Rate and Rhythm: Normal rate and regular rhythm.     Heart sounds: No murmur heard. Pulmonary:     Effort: Pulmonary effort is normal. No respiratory distress.     Breath sounds: Normal breath sounds. No wheezing or rales.  Musculoskeletal:     Cervical back: Normal range of motion and neck supple.  Skin:    General: Skin is warm and dry.     Capillary Refill: Capillary refill takes less than 2 seconds.  Neurological:     General: No focal deficit present.  Mental Status: She is alert and oriented to person, place, and time. Mental status is at baseline.  Psychiatric:        Mood and Affect: Mood normal.        Behavior: Behavior normal.        Thought Content: Thought content normal.        Judgment: Judgment normal.     Results for orders placed or performed in visit on 12/10/23  Comp Met (CMET)   Collection Time: 12/10/23  1:45 PM  Result Value Ref Range   Glucose 91 70 - 99 mg/dL   BUN 13 6 - 24 mg/dL   Creatinine, Ser 9.16 0.57 - 1.00 mg/dL   eGFR 84 >40 fO/fpw/8.26   BUN/Creatinine Ratio 16 9 - 23   Sodium 143 134 - 144 mmol/L   Potassium 4.4 3.5 - 5.2 mmol/L   Chloride 106 96 - 106 mmol/L   CO2 21 20 - 29 mmol/L   Calcium  9.8 8.7 - 10.2 mg/dL   Total Protein 7.3 6.0 - 8.5 g/dL   Albumin 4.5 3.8 - 4.9 g/dL   Globulin, Total 2.8 1.5 - 4.5 g/dL   Bilirubin Total 0.2 0.0 - 1.2 mg/dL   Alkaline Phosphatase 63 44 - 121  IU/L   AST 17 0 - 40 IU/L   ALT 19 0 - 32 IU/L  Lipid panel   Collection Time: 12/10/23  1:45 PM  Result Value Ref Range   Cholesterol, Total 256 (H) 100 - 199 mg/dL   Triglycerides 662 (H) 0 - 149 mg/dL   HDL 34 (L) >60 mg/dL   VLDL Cholesterol Cal 64 (H) 5 - 40 mg/dL   LDL Chol Calc (NIH) 841 (H) 0 - 99 mg/dL   Chol/HDL Ratio 7.5 (H) 0.0 - 4.4 ratio  Vitamin D  (25 hydroxy)   Collection Time: 12/10/23  1:45 PM  Result Value Ref Range   Vit D, 25-Hydroxy 39.9 30.0 - 100.0 ng/mL      Assessment & Plan:   Problem List Items Addressed This Visit   None Visit Diagnoses       Acute otitis media, unspecified otitis media type    -  Primary   Will treat with amoxicillin .  Complete course of antibiotics.  Follow up if not improved.   Relevant Medications   amoxicillin  (AMOXIL ) 500 MG capsule        Follow up plan: No follow-ups on file.

## 2024-04-22 MED ORDER — AMOXICILLIN 500 MG PO CAPS
500.0000 mg | ORAL_CAPSULE | Freq: Two times a day (BID) | ORAL | 0 refills | Status: AC
Start: 2024-04-22 — End: 2024-05-02

## 2024-04-22 NOTE — Addendum Note (Signed)
 Addended by: MELVIN PAO on: 04/22/2024 07:54 AM   Modules accepted: Orders

## 2024-05-12 ENCOUNTER — Telehealth: Payer: Self-pay

## 2024-05-12 ENCOUNTER — Other Ambulatory Visit (HOSPITAL_COMMUNITY): Payer: Self-pay

## 2024-05-12 NOTE — Telephone Encounter (Signed)
 Pharmacy Patient Advocate Encounter   Received notification from Onbase that prior authorization for Wegovy  1mg /dose is required/requested.   Insurance verification completed.   The patient is insured through [facility] MEDICAID.   Per test claim: Effective October 1st, Medicaid will discontinue coverage of GLP1 medications for weight loss (such as Wegovy  and Zepbound), unless the patient has a documented history of a heart attack or stroke. Zepbound will continue to be covered only for patients with moderate to severe sleep apnea (AHI 15-30) and a BMI greater than 40. Because of this change, the prior authorization team will not be submitting new PA requests for GLP1 medications prescribed for weight loss, as patients will be unable to continue therapy under Medicaid coverage.

## 2024-05-20 ENCOUNTER — Other Ambulatory Visit (HOSPITAL_COMMUNITY): Payer: Self-pay

## 2024-05-20 ENCOUNTER — Telehealth: Payer: Self-pay

## 2024-05-20 NOTE — Telephone Encounter (Signed)
 Pharmacy Patient Advocate Encounter   Received notification from Onbase that prior authorization for Wegovy  1 mg/0.5 ml auto injectors is required/requested.   Insurance verification completed.   The patient is insured through [facility] MEDICAID.   Per test claim: Effective October 1st, Medicaid will discontinue coverage of GLP1 medications for weight loss (such as Wegovy  and Zepbound), unless the patient has a documented history of a heart attack or stroke. Zepbound will continue to be covered only for patients with moderate to severe sleep apnea (AHI 15-30) and a BMI greater than 40. Because of this change, the prior authorization team will not be submitting new PA requests for GLP1 medications prescribed for weight loss, as patients will be unable to continue therapy under Medicaid coverage.

## 2024-06-08 ENCOUNTER — Other Ambulatory Visit (HOSPITAL_COMMUNITY): Payer: Self-pay

## 2024-06-17 ENCOUNTER — Encounter: Payer: Self-pay | Admitting: Nurse Practitioner

## 2024-06-17 ENCOUNTER — Ambulatory Visit (INDEPENDENT_AMBULATORY_CARE_PROVIDER_SITE_OTHER): Admitting: Nurse Practitioner

## 2024-06-17 VITALS — BP 136/79 | HR 114 | Temp 97.9°F | Ht 62.1 in | Wt 192.2 lb

## 2024-06-17 DIAGNOSIS — E66812 Obesity, class 2: Secondary | ICD-10-CM

## 2024-06-17 DIAGNOSIS — H6690 Otitis media, unspecified, unspecified ear: Secondary | ICD-10-CM | POA: Diagnosis not present

## 2024-06-17 DIAGNOSIS — H8102 Meniere's disease, left ear: Secondary | ICD-10-CM | POA: Diagnosis not present

## 2024-06-17 MED ORDER — LORAZEPAM 0.5 MG PO TABS: 0.5000 mg | ORAL_TABLET | Freq: Three times a day (TID) | ORAL | 0 refills | Status: AC | PRN

## 2024-06-17 MED ORDER — METHYLPREDNISOLONE 4 MG PO TBPK: ORAL_TABLET | ORAL | 0 refills | Status: DC

## 2024-06-17 MED ORDER — WEGOVY 0.25 MG/0.5ML ~~LOC~~ SOAJ: 0.2500 mg | SUBCUTANEOUS | 0 refills | Status: DC

## 2024-06-17 MED ORDER — AMOXICILLIN 500 MG PO CAPS: 500.0000 mg | ORAL_CAPSULE | Freq: Two times a day (BID) | ORAL | 0 refills | Status: AC

## 2024-06-17 MED ORDER — EPINEPHRINE 0.3 MG/0.3ML IJ SOAJ: 0.3000 mg | INTRAMUSCULAR | 0 refills | Status: AC | PRN

## 2024-06-17 MED ORDER — ALBUTEROL SULFATE HFA 108 (90 BASE) MCG/ACT IN AERS: 2.0000 | INHALATION_SPRAY | Freq: Four times a day (QID) | RESPIRATORY_TRACT | 0 refills | Status: AC | PRN

## 2024-06-17 NOTE — Assessment & Plan Note (Signed)
 Chronic. Ongoing.  Chronic.  Continue with current medication regimen. Encouraged patient to be consistent with medications.  Uses PRN lorazepam  to help with dizziness.  Feels the most benefit out of scopolamine  patch.  Refills sent today.  Follow up in 6 months.  Call sooner if concerns arise.

## 2024-06-17 NOTE — Progress Notes (Signed)
 BP 136/79 (BP Location: Left Arm, Cuff Size: Large)   Pulse (!) 114   Temp 97.9 F (36.6 C) (Oral)   Ht 5' 2.1 (1.577 m)   Wt 192 lb 3.2 oz (87.2 kg)   LMP 04/07/2024 (Approximate)   SpO2 98%   BMI 35.04 kg/m    Subjective:    Patient ID: Dawn Taylor, female    DOB: 04-Aug-1971, 53 y.o.   MRN: [id number]  HPI: Dawn Taylor is a 53 y.o. female  Chief Complaint  Patient presents with   Ear Pain    Patient states she has been having constant ear pain for the last weeks. States the pain has gotten worse in the last 3 days. Described the pain as stabbing. Also states that it hurts more when she lays down. States sounds are very muffled and feels like something is in there.    EAR PAIN Patient states she has been having ear pain off and on for the last 3 weeks.  States it is worse when she is laying down.  Her eyes are tearing.  She took a Zyrtec D last night and did help the pain some.  Denies any fever, congestion, runny nose, sore throat. She feels like sounds are muffled.   Relevant past medical, surgical, family and social history reviewed and updated as indicated. Interim medical history since our last visit reviewed. Allergies and medications reviewed and updated.  Review of Systems  HENT:  Positive for congestion and ear pain.     Per HPI unless specifically indicated above     Objective:    BP 136/79 (BP Location: Left Arm, Cuff Size: Large)   Pulse (!) 114   Temp 97.9 F (36.6 C) (Oral)   Ht 5' 2.1 (1.577 m)   Wt 192 lb 3.2 oz (87.2 kg)   LMP 04/07/2024 (Approximate)   SpO2 98%   BMI 35.04 kg/m   Wt Readings from Last 3 Encounters:  06/17/24 192 lb 3.2 oz (87.2 kg)  04/21/24 190 lb 6.4 oz (86.4 kg)  04/14/24 189 lb 12.8 oz (86.1 kg)    Physical Exam Vitals and nursing note reviewed.  Constitutional:      General: She is not in acute distress.    Appearance: Normal appearance. She is normal weight. She is not ill-appearing, toxic-appearing or  diaphoretic.  HENT:     Head: Normocephalic.     Right Ear: External ear normal. Tenderness present. A middle ear effusion is present. Tympanic membrane is erythematous.     Left Ear: External ear normal. A middle ear effusion is present.     Nose: Nose normal.     Mouth/Throat:     Mouth: Mucous membranes are moist.     Pharynx: Oropharynx is clear.  Eyes:     General:        Right eye: No discharge.        Left eye: No discharge.     Extraocular Movements: Extraocular movements intact.     Conjunctiva/sclera: Conjunctivae normal.     Pupils: Pupils are equal, round, and reactive to light.  Cardiovascular:     Rate and Rhythm: Normal rate and regular rhythm.     Heart sounds: No murmur heard. Pulmonary:     Effort: Pulmonary effort is normal. No respiratory distress.     Breath sounds: Normal breath sounds. No wheezing or rales.  Musculoskeletal:     Cervical back: Normal range of motion and neck supple.  Skin:  General: Skin is warm and dry.     Capillary Refill: Capillary refill takes less than 2 seconds.  Neurological:     General: No focal deficit present.     Mental Status: She is alert and oriented to person, place, and time. Mental status is at baseline.  Psychiatric:        Mood and Affect: Mood normal.        Behavior: Behavior normal.        Thought Content: Thought content normal.        Judgment: Judgment normal.     Results for orders placed or performed in visit on 12/10/23  Comp Met (CMET)   Collection Time: 12/10/23  1:45 PM  Result Value Ref Range   Glucose 91 70 - 99 mg/dL   BUN 13 6 - 24 mg/dL   Creatinine, Ser 9.16 0.57 - 1.00 mg/dL   eGFR 84 >40 fO/fpw/8.26   BUN/Creatinine Ratio 16 9 - 23   Sodium 143 134 - 144 mmol/L   Potassium 4.4 3.5 - 5.2 mmol/L   Chloride 106 96 - 106 mmol/L   CO2 21 20 - 29 mmol/L   Calcium  9.8 8.7 - 10.2 mg/dL   Total Protein 7.3 6.0 - 8.5 g/dL   Albumin 4.5 3.8 - 4.9 g/dL   Globulin, Total 2.8 1.5 - 4.5 g/dL    Bilirubin Total 0.2 0.0 - 1.2 mg/dL   Alkaline Phosphatase 63 44 - 121 IU/L   AST 17 0 - 40 IU/L   ALT 19 0 - 32 IU/L  Lipid panel   Collection Time: 12/10/23  1:45 PM  Result Value Ref Range   Cholesterol, Total 256 (H) 100 - 199 mg/dL   Triglycerides 662 (H) 0 - 149 mg/dL   HDL 34 (L) >60 mg/dL   VLDL Cholesterol Cal 64 (H) 5 - 40 mg/dL   LDL Chol Calc (NIH) 841 (H) 0 - 99 mg/dL   Chol/HDL Ratio 7.5 (H) 0.0 - 4.4 ratio  Vitamin D  (25 hydroxy)   Collection Time: 12/10/23  1:45 PM  Result Value Ref Range   Vit D, 25-Hydroxy 39.9 30.0 - 100.0 ng/mL      Assessment & Plan:   Problem List Items Addressed This Visit       Nervous and Auditory   Meniere disease, left   Chronic. Ongoing.  Chronic.  Continue with current medication regimen. Encouraged patient to be consistent with medications.  Uses PRN lorazepam  to help with dizziness.  Feels the most benefit out of scopolamine  patch.  Refills sent today.  Follow up in 6 months.  Call sooner if concerns arise.         Other   Class II obesity   Patient plans to may out of pocked for Wegovy .  Has been off medication for about 1 month.  Will restart Wegovy  0.25mg  weekly.  Can increase to Wegovy  0.5mg  weekly after first month.       Other Visit Diagnoses       Acute otitis media, unspecified otitis media type    -  Primary   Will treat with medrol  dose pak and amoxicillin .  Complete course of medication.  Follow up if not improved.   Relevant Medications   amoxicillin  (AMOXIL ) 500 MG capsule        Follow up plan: No follow-ups on file.   A total of 30 minutes were spent on this encounter today.  When total time is documented, this includes both the  face-to-face and non-face-to-face time personally spent before, during and after the visit on the date of the encounter reviewing medications, symptoms, plan of care and follow up.

## 2024-06-17 NOTE — Assessment & Plan Note (Signed)
 Patient plans to may out of pocked for Wegovy .  Has been off medication for about 1 month.  Will restart Wegovy  0.25mg  weekly.  Can increase to Wegovy  0.5mg  weekly after first month.

## 2024-06-22 ENCOUNTER — Other Ambulatory Visit (HOSPITAL_COMMUNITY): Payer: Self-pay

## 2024-07-19 ENCOUNTER — Ambulatory Visit (INDEPENDENT_AMBULATORY_CARE_PROVIDER_SITE_OTHER): Admitting: Nurse Practitioner

## 2024-07-19 ENCOUNTER — Encounter: Payer: Self-pay | Admitting: Nurse Practitioner

## 2024-07-19 VITALS — BP 124/89 | Temp 98.8°F | Ht 62.09 in | Wt 190.2 lb

## 2024-07-19 DIAGNOSIS — E7800 Pure hypercholesterolemia, unspecified: Secondary | ICD-10-CM | POA: Diagnosis not present

## 2024-07-19 DIAGNOSIS — I10 Essential (primary) hypertension: Secondary | ICD-10-CM | POA: Diagnosis not present

## 2024-07-19 DIAGNOSIS — E559 Vitamin D deficiency, unspecified: Secondary | ICD-10-CM

## 2024-07-19 DIAGNOSIS — F419 Anxiety disorder, unspecified: Secondary | ICD-10-CM | POA: Diagnosis not present

## 2024-07-19 NOTE — Assessment & Plan Note (Signed)
Chronic. Improved with current medication regimen. Continue with Paxil and lorazepam PRN. Refills sent today.  Return to clinic in 6 months for reevaluation. Call sooner if concerns arise.

## 2024-07-19 NOTE — Assessment & Plan Note (Signed)
 Labs ordered at visit today.  Will make recommendations based on lab results.

## 2024-07-19 NOTE — Assessment & Plan Note (Signed)
 Chronic.  Elevated in the office but well controlled at home.  Continue with current medication regimen of HCTZ.  Labs ordered.  Follow up in 6 months.  Call sooner if concerns arise.

## 2024-07-19 NOTE — Progress Notes (Signed)
 BP 124/89 Comment: Home blood pressure reading  Temp 98.8 F (37.1 C) (Oral)   Ht 5' 2.09 (1.577 m)   Wt 190 lb 3.2 oz (86.3 kg)   SpO2 94%   BMI 34.69 kg/m    Subjective:    Patient ID: Dawn Taylor, female    DOB: [birth year], 53 y.o.   MRN: [id number]  HPI: Dawn Taylor is a 53 y.o. female  Chief Complaint  Patient presents with   office visit    3 month F/u.   DIZZINESS Patient is here for follow up on her Menieres.  She is dealing with it daily.  She feels like the patches help the most. She is taking the hydrochlorothiazide  daily.  She does taking Allegra  and occasionally take Zyrtec D.  Duration: weeks Description of symptoms: room spinning Duration of episode: hours Dizziness frequency: recurrent Provoking factors: laying down and moving. Aggravating factors:  movement Triggered by rolling over in bed: yes Triggered by bending over: yes Aggravated by head movement: yes Aggravated by exertion, coughing, loud noises: no Recent head injury: no Recent or current viral symptoms: no History of vasovagal episodes: no Nausea: yes Vomiting: no Tinnitus: yes Hearing loss: yes Aural fullness: no Headache: no Photophobia/phonophobia: no Unsteady gait: no Postural instability: no Diplopia, dysarthria, dysphagia or weakness: no Related to exertion: no Pallor: no Diaphoresis: no Dyspnea: no Chest pain: no   HYPERTENSION Patient states she is not taking the hyrdralazine.  She is taking the HCTZ.   without Chronic Kidney Disease Hypertension status: controlled Satisfied with current treatment? no Duration of hypertension: years BP monitoring frequency: daily BP range: 124/89 BP medication side effects:  no Medication compliance: not compliant Previous BP meds:amlodipine  Aspirin: no Recurrent headaches: yes Visual changes: no Palpitations: no Dyspnea: no Chest pain: no Lower extremity edema: no Dizzy/lightheaded: yes  Patient states she is still  working on weight loss. She is trying to do intermittent fasting and eating smaller portions.  She has lost 2lbs and doing a lot more episodes.       Relevant past medical, surgical, family and social history reviewed and updated as indicated. Interim medical history since our last visit reviewed. Allergies and medications reviewed and updated.  Review of Systems  Eyes:  Negative for visual disturbance.  Respiratory:  Negative for cough, chest tightness and shortness of breath.   Cardiovascular:  Negative for chest pain, palpitations and leg swelling.  Gastrointestinal:        Reflux.  Neurological:  Positive for dizziness. Negative for headaches.    Per HPI unless specifically indicated above     Objective:    BP 124/89 Comment: Home blood pressure reading  Temp 98.8 F (37.1 C) (Oral)   Ht 5' 2.09 (1.577 m)   Wt 190 lb 3.2 oz (86.3 kg)   SpO2 94%   BMI 34.69 kg/m   Wt Readings from Last 3 Encounters:  07/19/24 190 lb 3.2 oz (86.3 kg)  06/17/24 192 lb 3.2 oz (87.2 kg)  04/21/24 190 lb 6.4 oz (86.4 kg)    Physical Exam Vitals and nursing note reviewed.  Constitutional:      General: She is not in acute distress.    Appearance: Normal appearance. She is obese. She is not ill-appearing, toxic-appearing or diaphoretic.  HENT:     Head: Normocephalic.     Right Ear: External ear normal. A middle ear effusion is present. Tympanic membrane is not erythematous.     Left Ear: External ear  normal. A middle ear effusion is present. Tympanic membrane is not erythematous.     Nose: Nose normal.     Mouth/Throat:     Mouth: Mucous membranes are moist.     Pharynx: Oropharynx is clear.  Eyes:     General:        Right eye: No discharge.        Left eye: No discharge.     Extraocular Movements: Extraocular movements intact.     Conjunctiva/sclera: Conjunctivae normal.     Pupils: Pupils are equal, round, and reactive to light.  Cardiovascular:     Rate and Rhythm: Normal  rate and regular rhythm.     Heart sounds: No murmur heard. Pulmonary:     Effort: Pulmonary effort is normal. No respiratory distress.     Breath sounds: Normal breath sounds. No wheezing or rales.  Musculoskeletal:     Cervical back: Normal range of motion and neck supple.  Skin:    General: Skin is warm and dry.     Capillary Refill: Capillary refill takes less than 2 seconds.  Neurological:     General: No focal deficit present.     Mental Status: She is alert and oriented to person, place, and time. Mental status is at baseline.  Psychiatric:        Mood and Affect: Mood normal.        Behavior: Behavior normal.        Thought Content: Thought content normal.        Judgment: Judgment normal.     Results for orders placed or performed in visit on 12/10/23  Comp Met (CMET)   Collection Time: 12/10/23  1:45 PM  Result Value Ref Range   Glucose 91 70 - 99 mg/dL   BUN 13 6 - 24 mg/dL   Creatinine, Ser 9.16 0.57 - 1.00 mg/dL   eGFR 84 >40 fO/fpw/8.26   BUN/Creatinine Ratio 16 9 - 23   Sodium 143 134 - 144 mmol/L   Potassium 4.4 3.5 - 5.2 mmol/L   Chloride 106 96 - 106 mmol/L   CO2 21 20 - 29 mmol/L   Calcium  9.8 8.7 - 10.2 mg/dL   Total Protein 7.3 6.0 - 8.5 g/dL   Albumin 4.5 3.8 - 4.9 g/dL   Globulin, Total 2.8 1.5 - 4.5 g/dL   Bilirubin Total 0.2 0.0 - 1.2 mg/dL   Alkaline Phosphatase 63 44 - 121 IU/L   AST 17 0 - 40 IU/L   ALT 19 0 - 32 IU/L  Lipid panel   Collection Time: 12/10/23  1:45 PM  Result Value Ref Range   Cholesterol, Total 256 (H) 100 - 199 mg/dL   Triglycerides 662 (H) 0 - 149 mg/dL   HDL 34 (L) >60 mg/dL   VLDL Cholesterol Cal 64 (H) 5 - 40 mg/dL   LDL Chol Calc (NIH) 841 (H) 0 - 99 mg/dL   Chol/HDL Ratio 7.5 (H) 0.0 - 4.4 ratio  Vitamin D  (25 hydroxy)   Collection Time: 12/10/23  1:45 PM  Result Value Ref Range   Vit D, 25-Hydroxy 39.9 30.0 - 100.0 ng/mL      Assessment & Plan:   Problem List Items Addressed This Visit        Cardiovascular and Mediastinum   Essential hypertension - Primary   Chronic.  Elevated in the office but well controlled at home.  Continue with current medication regimen of HCTZ.  Labs ordered.  Follow up in 6  months.  Call sooner if concerns arise.      Relevant Orders   Comprehensive metabolic panel with GFR     Other   Anxiety   Chronic. Improved with current medication regimen. Continue with Paxil  and lorazepam  PRN. Refills sent today.  Return to clinic in 6 months for reevaluation. Call sooner if concerns arise.       Hypercholesteremia   Labs ordered at visit today.  Will make recommendations based on lab results.       Relevant Orders   Lipid Profile   Other Visit Diagnoses       Vitamin D  deficiency disease       Labs ordered. Will make recommendations based on results.   Relevant Orders   Vitamin D  (25 hydroxy)        Follow up plan: Return in about 6 months (around 01/17/2025) for Physical and Fasting labs.

## 2024-07-20 ENCOUNTER — Ambulatory Visit: Payer: Self-pay | Admitting: Nurse Practitioner

## 2024-07-20 LAB — COMPREHENSIVE METABOLIC PANEL WITH GFR
ALT: 19 IU/L (ref 0–32)
AST: 17 IU/L (ref 0–40)
Albumin: 4.7 g/dL (ref 3.8–4.9)
Alkaline Phosphatase: 70 IU/L (ref 49–135)
BUN/Creatinine Ratio: 18 (ref 9–23)
BUN: 16 mg/dL (ref 6–24)
Bilirubin Total: 0.4 mg/dL (ref 0.0–1.2)
CO2: 20 mmol/L (ref 20–29)
Calcium: 10.3 mg/dL — ABNORMAL HIGH (ref 8.7–10.2)
Chloride: 102 mmol/L (ref 96–106)
Creatinine, Ser: 0.9 mg/dL (ref 0.57–1.00)
Globulin, Total: 3.2 g/dL (ref 1.5–4.5)
Glucose: 101 mg/dL — ABNORMAL HIGH (ref 70–99)
Potassium: 4.1 mmol/L (ref 3.5–5.2)
Sodium: 141 mmol/L (ref 134–144)
Total Protein: 7.9 g/dL (ref 6.0–8.5)
eGFR: 76 mL/min/1.73 (ref >59)

## 2024-07-20 LAB — LIPID PANEL
Chol/HDL Ratio: 8.6 ratio — ABNORMAL HIGH (ref 0.0–4.4)
Cholesterol, Total: 291 mg/dL — ABNORMAL HIGH (ref 100–199)
HDL: 34 mg/dL — ABNORMAL LOW (ref >39)
LDL Chol Calc (NIH): 171 mg/dL — ABNORMAL HIGH (ref 0–99)
Triglycerides: 437 mg/dL — ABNORMAL HIGH (ref 0–149)
VLDL Cholesterol Cal: 86 mg/dL — ABNORMAL HIGH (ref 5–40)

## 2024-07-20 LAB — VITAMIN D 25 HYDROXY (VIT D DEFICIENCY, FRACTURES): Vit D, 25-Hydroxy: 57 ng/mL (ref 30.0–100.0)
